# Patient Record
Sex: Female | Born: 1970 | Race: Black or African American | Hispanic: No | Marital: Single | Smoking: Never smoker
Health system: Southern US, Community
[De-identification: ages and names within clinical notes are randomized; demographics above are authoritative.]

## PROBLEM LIST (undated history)

## (undated) DIAGNOSIS — T7840XA Allergy, unspecified, initial encounter: Secondary | ICD-10-CM

## (undated) DIAGNOSIS — R7309 Other abnormal glucose: Secondary | ICD-10-CM

## (undated) DIAGNOSIS — D649 Anemia, unspecified: Secondary | ICD-10-CM

## (undated) DIAGNOSIS — R5383 Other fatigue: Secondary | ICD-10-CM

## (undated) DIAGNOSIS — R7303 Prediabetes: Secondary | ICD-10-CM

## (undated) DIAGNOSIS — E559 Vitamin D deficiency, unspecified: Secondary | ICD-10-CM

## (undated) HISTORY — DX: Prediabetes: R73.03

## (undated) HISTORY — DX: Vitamin D deficiency, unspecified: E55.9

## (undated) HISTORY — PX: TOOTH EXTRACTION: SUR596

## (undated) HISTORY — PX: WISDOM TOOTH EXTRACTION: SHX21

## (undated) HISTORY — DX: Allergy, unspecified, initial encounter: T78.40XA

## (undated) HISTORY — DX: Anemia, unspecified: D64.9

## (undated) HISTORY — DX: Other abnormal glucose: R73.09

## (undated) HISTORY — DX: Other fatigue: R53.83

---

## 1999-03-15 DIAGNOSIS — Z5189 Encounter for other specified aftercare: Secondary | ICD-10-CM

## 1999-03-15 HISTORY — DX: Encounter for other specified aftercare: Z51.89

## 2013-07-18 ENCOUNTER — Encounter (HOSPITAL_COMMUNITY): Payer: Self-pay | Admitting: Emergency Medicine

## 2013-07-18 ENCOUNTER — Emergency Department (INDEPENDENT_AMBULATORY_CARE_PROVIDER_SITE_OTHER)
Admission: EM | Admit: 2013-07-18 | Discharge: 2013-07-18 | Disposition: A | Discharge status: Home / Self Care | Payer: Self-pay | Source: Home / Self Care | Attending: Emergency Medicine | Admitting: Emergency Medicine

## 2013-07-18 DIAGNOSIS — S60459A Superficial foreign body of unspecified finger, initial encounter: Secondary | ICD-10-CM

## 2013-07-18 MED ORDER — SULFAMETHOXAZOLE-TRIMETHOPRIM 800-160 MG PO TABS: 1.0000 | ORAL_TABLET | Freq: Two times a day (BID) | ORAL | Status: DC

## 2013-07-18 NOTE — ED Notes (Signed)
Splinter of lead pencil in R small finger over PIP joint on Monday.  She tried Monday to get it out without success.

## 2013-07-18 NOTE — ED Provider Notes (Signed)
CSN: [id number]     Arrival date & time 07/18/13  1818 History   First MD Initiated Contact with Patient 07/18/13 1934     Chief Complaint  Patient presents with  . Foreign Body in Skin   (Consider location/radiation/quality/duration/timing/severity/associated sxs/prior Treatment) HPI Patient is a 43 yo F presenting with lead in her right small finger x4 days. She states she was retracting a pencil and it stuck in her finger. She tried using a sterile needle to get the lead out but was unsuccessful. 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 No fevers, no swelling, no pus or oozing. States this is very bothersome to her.  History reviewed. No pertinent past medical history. Past Surgical History  Procedure Laterality Date  . Cesarean section  1995, 2001    x 2   Family History  Problem Relation Age of Onset  . Diabetes Father   . Hypertension Father   . Cancer Father     liver and gallbladder   History  Substance Use Topics  . Smoking status: Never Smoker   . Smokeless tobacco: Not on file  . Alcohol Use: Not on file   OB History   Grav Para Term Preterm Abortions TAB SAB Ect Mult Living                 Review of Systems  Constitutional: Negative for fever and chills.  HENT: Negative for congestion.   Eyes: Negative for visual disturbance.  Respiratory: Negative for cough and shortness of breath.   Cardiovascular: Negative for chest pain and leg swelling.  Gastrointestinal: Negative for abdominal pain.  Genitourinary: Negative for dysuria.  Musculoskeletal: Negative for arthralgias, joint swelling and myalgias.  Skin: Positive for rash.  Neurological: Negative for headaches.    Allergies  Pine and Latex  Home Medications   Prior to Admission medications   Not on File   BP 113/77  Pulse 77  Temp(Src) 98.5 F (36.9 C) (Oral)  Resp 16  SpO2 100%  LMP 07/18/2013 Physical Exam  Constitutional: She is oriented to  person, place, and time. She appears well-developed and well-nourished. No distress.  HENT:  Head: Normocephalic and atraumatic.  Mouth/Throat: Oropharynx is clear and moist.  Musculoskeletal: Normal range of motion. She exhibits tenderness. She exhibits no edema.  Neurological: She is alert and oriented to person, place, and time.  Skin: Skin is warm and dry.  Abrasion noted over right small finger PIP with gray speck noted in the middle of the abrasion. Area cleaned with betadine and alcohol and prodded with 22g needle. No obvious piece of lead noted. Unable to extract any foreign body.  Psychiatric: She has a normal mood and affect.    ED Course  Procedures (including critical care time) Labs Review Labs Reviewed - No data to display  Imaging Review No results found.  MDM   1. Foreign body of finger    Lead in finger, but not obviously noted on exam - Bactrim DS x3 days since patient try to explore area as did I.  - Warm soaks - Tylenol as needed for pain - Red flags discussed - Given reassurance    Montez Morita, MD 07/18/13 2018

## 2013-07-19 NOTE — ED Provider Notes (Signed)
Medical screening examination/treatment/procedure(s) were performed by a resident physician and as supervising physician I was immediately available for consultation/collaboration.  Philipp Deputy, M.D.   Harden Mo, MD 07/19/13 1302

## 2018-12-18 ENCOUNTER — Other Ambulatory Visit: Payer: Self-pay

## 2018-12-18 DIAGNOSIS — Z20822 Contact with and (suspected) exposure to covid-19: Secondary | ICD-10-CM

## 2018-12-18 NOTE — Progress Notes (Signed)
[id number] 

## 2018-12-21 LAB — NOVEL CORONAVIRUS, NAA: SARS-CoV-2, NAA: NOT DETECTED

## 2019-02-15 ENCOUNTER — Other Ambulatory Visit: Payer: Self-pay

## 2019-02-15 DIAGNOSIS — Z20822 Contact with and (suspected) exposure to covid-19: Secondary | ICD-10-CM

## 2019-02-16 LAB — NOVEL CORONAVIRUS, NAA: SARS-CoV-2, NAA: NOT DETECTED

## 2019-08-17 ENCOUNTER — Ambulatory Visit: Payer: Self-pay | Attending: Internal Medicine

## 2019-08-17 DIAGNOSIS — Z23 Encounter for immunization: Secondary | ICD-10-CM

## 2019-08-17 NOTE — Progress Notes (Signed)
   Covid-19 Vaccination Clinic  Name:  Kathy Merritt    MRN: [id number] DOB: 03/22/1970  08/17/2019  Kathy Merritt was observed post Covid-19 immunization for 15 minutes without incident. She was provided with Vaccine Information Sheet and instruction to access the V-Safe system.   Kathy Merritt was instructed to call 911 with any severe reactions post vaccine: Marland Kitchen Difficulty breathing  . Swelling of face and throat  . A fast heartbeat  . A bad rash all over body  . Dizziness and weakness   Immunizations Administered    Name Date Dose VIS Date Route   JANSSEN COVID-19 VACCINE 08/17/2019  9:47 AM 0.5 mL 05/11/2019 Intramuscular   Manufacturer: [employer]   Lot: [id number]   Gage: [phone]

## 2019-09-15 ENCOUNTER — Ambulatory Visit (HOSPITAL_COMMUNITY)
Admission: RE | Admit: 2019-09-15 | Discharge: 2019-09-15 | Disposition: A | Discharge status: Home / Self Care | Payer: Self-pay | Source: Ambulatory Visit | Attending: Family Medicine | Admitting: Family Medicine

## 2019-09-15 ENCOUNTER — Encounter (HOSPITAL_COMMUNITY): Payer: Self-pay

## 2019-09-15 ENCOUNTER — Other Ambulatory Visit: Payer: Self-pay

## 2019-09-15 VITALS — BP 124/86 | HR 93 | Temp 99.2°F | Resp 18

## 2019-09-15 DIAGNOSIS — T7840XA Allergy, unspecified, initial encounter: Secondary | ICD-10-CM

## 2019-09-15 MED ORDER — PREDNISONE 20 MG PO TABS: ORAL_TABLET | ORAL | 0 refills | Status: DC

## 2019-09-15 NOTE — ED Triage Notes (Signed)
Pt presents with itching rash on arms, kegs and back x 5 days. Cortisone cream gives somewhat relieve.

## 2019-09-15 NOTE — ED Provider Notes (Addendum)
[facility]    CSN: [id number] Arrival date & time: 09/15/19  1039      History   Chief Complaint Chief Complaint  Patient presents with  . Rash    HPI Kathy Merritt is a 49 y.o. female.   First [facility] patient visit since 2015 for this 49 yo woman complaining of generalized body rash.  Patient received SARS vaccine 08/17/2019.  This patient has been on a fast prescribed by her pastor which includes a special kind of tea.  She has been on this for about 4 weeks and her rash began about 5 days ago.  The rash involves primarily the inner aspect of her arms and thighs with some dermatographia on the legs.  Cortisone cream seems to help a bit.  Pruritic sandpaper quality to the rash.     History reviewed. No pertinent past medical history.  There are no problems to display for this patient.   Past Surgical History:  Procedure Laterality Date  . [place], 2001   x 2    OB History   No obstetric history on file.      Home Medications    Prior to Admission medications   Medication Sig Start Date End Date Taking? Authorizing Provider  b complex vitamins capsule Take 1 capsule by mouth daily.    [provider]  calcium citrate-vitamin D (CITRACAL+D) 315-200 MG-UNIT per tablet Take 1 tablet by mouth 2 (two) times daily.    [provider]  magnesium 30 MG tablet Take 30 mg by mouth 2 (two) times daily.    [provider]  Multiple Vitamins-Minerals (MULTIVITAMIN WITH MINERALS) tablet Take 1 tablet by mouth daily.    [provider]  predniSONE (DELTASONE) 20 MG tablet One daily with fluids and cracker. 09/15/19   Robyn Haber, MD  sulfamethoxazole-trimethoprim (BACTRIM DS) 800-160 MG per tablet Take 1 tablet by mouth 2 (two) times daily. 07/18/13   Hairford, Tyler Pita, MD    Family History Family History  Problem Relation Age of Onset  . Diabetes Father   . Hypertension Father   . Cancer Father         liver and gallbladder    Social History Social History   Tobacco Use  . Smoking status: Never Smoker  . Smokeless tobacco: Never Used  Substance Use Topics  . Alcohol use: Not on file  . Drug use: No     Allergies   Pine and Latex   Review of Systems Review of Systems  Constitutional: Negative.   HENT: Negative.   Gastrointestinal: Negative.   Musculoskeletal: Negative.   Skin: Positive for rash.     Physical Exam Triage Vital Signs ED Triage Vitals  Enc Vitals Group     BP      Pulse      Resp      Temp      Temp src      SpO2      Weight      Height      Head Circumference      Peak Flow      Pain Score      Pain Loc      Pain Edu?      Excl. in [place]?    No data found.  Updated Vital Signs BP 124/86 (BP Location: Right Arm)   Pulse 93   Temp 99.2 F (37.3 C) (Oral)   Resp 18   LMP 07/18/2013  SpO2 100%   Physical Exam Vitals and nursing note reviewed.  Constitutional:      General: She is not in acute distress.    Appearance: Normal appearance. She is obese. She is not ill-appearing or toxic-appearing.  HENT:     Head: Normocephalic.     Mouth/Throat:     Pharynx: Oropharynx is clear.  Eyes:     Conjunctiva/sclera: Conjunctivae normal.  Pulmonary:     Effort: Pulmonary effort is normal.  Musculoskeletal:        General: Normal range of motion.     Cervical back: Normal range of motion and neck supple.  Skin:    Findings: Rash present.  Neurological:     General: No focal deficit present.     Mental Status: She is alert.  Psychiatric:        Mood and Affect: Mood normal.        Thought Content: Thought content normal.        Judgment: Judgment normal.        UC Treatments / Results  Labs (all labs ordered are listed, but only abnormal results are displayed) Labs Reviewed - No data to display  EKG   Radiology No results found.  Procedures Procedures (including critical care time)  Medications Ordered in  UC Medications - No data to display  Initial Impression / Assessment and Plan / UC Course  I have reviewed the triage vital signs and the nursing notes.  Pertinent labs & imaging results that were available during my care of the patient were reviewed by me and considered in my medical decision making (see chart for details).    Final Clinical Impressions(s) / UC Diagnoses   Final diagnoses:  Allergic reaction, initial encounter     Discharge Instructions     Switch to a different kind of tea since we are suspicious that this brand may have some chemicals that are causing the rash.    ED Prescriptions    Medication Sig Dispense Auth. Provider   predniSONE (DELTASONE) 20 MG tablet One daily with fluids and cracker. 5 tablet Robyn Haber, MD     PDMP not reviewed this encounter.   Robyn Haber, MD 09/15/19 1126    Robyn Haber, MD 09/15/19 1128    Robyn Haber, MD 09/15/19 1130

## 2019-09-15 NOTE — Discharge Instructions (Addendum)
Switch to a different kind of tea since we are suspicious that this brand may have some chemicals that are causing the rash.

## 2020-01-20 ENCOUNTER — Other Ambulatory Visit: Payer: Self-pay

## 2020-01-20 DIAGNOSIS — Z20822 Contact with and (suspected) exposure to covid-19: Secondary | ICD-10-CM | POA: Diagnosis not present

## 2020-01-21 LAB — SARS-COV-2, NAA 2 DAY TAT

## 2020-01-21 LAB — NOVEL CORONAVIRUS, NAA: SARS-CoV-2, NAA: NOT DETECTED

## 2020-01-28 ENCOUNTER — Other Ambulatory Visit: Payer: Self-pay | Admitting: Family Medicine

## 2020-01-28 ENCOUNTER — Other Ambulatory Visit: Payer: Self-pay

## 2020-01-28 ENCOUNTER — Ambulatory Visit
Admission: RE | Admit: 2020-01-28 | Discharge: 2020-01-28 | Disposition: A | Discharge status: Home / Self Care | Payer: 59 | Source: Ambulatory Visit | Attending: Family Medicine | Admitting: Family Medicine

## 2020-01-28 DIAGNOSIS — Z1231 Encounter for screening mammogram for malignant neoplasm of breast: Secondary | ICD-10-CM

## 2020-01-30 ENCOUNTER — Other Ambulatory Visit: Payer: 59

## 2020-01-30 DIAGNOSIS — Z20822 Contact with and (suspected) exposure to covid-19: Secondary | ICD-10-CM

## 2020-01-31 LAB — SARS-COV-2, NAA 2 DAY TAT

## 2020-01-31 LAB — NOVEL CORONAVIRUS, NAA: SARS-CoV-2, NAA: NOT DETECTED

## 2020-02-03 ENCOUNTER — Other Ambulatory Visit: Payer: Self-pay | Admitting: Family Medicine

## 2020-02-03 DIAGNOSIS — R928 Other abnormal and inconclusive findings on diagnostic imaging of breast: Secondary | ICD-10-CM

## 2020-02-10 ENCOUNTER — Other Ambulatory Visit: Payer: 59

## 2020-02-10 DIAGNOSIS — Z20822 Contact with and (suspected) exposure to covid-19: Secondary | ICD-10-CM | POA: Diagnosis not present

## 2020-02-12 ENCOUNTER — Ambulatory Visit: Payer: 59 | Admitting: Physician Assistant

## 2020-02-12 LAB — SARS-COV-2, NAA 2 DAY TAT

## 2020-02-12 LAB — NOVEL CORONAVIRUS, NAA: SARS-CoV-2, NAA: NOT DETECTED

## 2020-02-17 ENCOUNTER — Ambulatory Visit: Payer: 59 | Admitting: Nurse Practitioner

## 2020-02-19 ENCOUNTER — Ambulatory Visit: Payer: 59

## 2020-02-19 ENCOUNTER — Other Ambulatory Visit: Payer: Self-pay

## 2020-02-19 ENCOUNTER — Other Ambulatory Visit: Payer: Self-pay | Admitting: Family Medicine

## 2020-02-19 ENCOUNTER — Ambulatory Visit
Admission: RE | Admit: 2020-02-19 | Discharge: 2020-02-19 | Disposition: A | Discharge status: Home / Self Care | Payer: 59 | Source: Ambulatory Visit | Attending: Family Medicine | Admitting: Family Medicine

## 2020-02-19 DIAGNOSIS — R928 Other abnormal and inconclusive findings on diagnostic imaging of breast: Secondary | ICD-10-CM

## 2020-02-19 DIAGNOSIS — N6321 Unspecified lump in the left breast, upper outer quadrant: Secondary | ICD-10-CM | POA: Diagnosis not present

## 2020-02-27 ENCOUNTER — Other Ambulatory Visit: Payer: 59

## 2020-02-28 ENCOUNTER — Other Ambulatory Visit: Payer: 59

## 2020-03-04 ENCOUNTER — Other Ambulatory Visit: Payer: 59

## 2020-03-23 ENCOUNTER — Ambulatory Visit
Admission: RE | Admit: 2020-03-23 | Discharge: 2020-03-23 | Disposition: A | Discharge status: Home / Self Care | Payer: 59 | Source: Ambulatory Visit | Attending: Family Medicine | Admitting: Family Medicine

## 2020-03-23 ENCOUNTER — Other Ambulatory Visit: Payer: Self-pay

## 2020-03-23 DIAGNOSIS — R928 Other abnormal and inconclusive findings on diagnostic imaging of breast: Secondary | ICD-10-CM

## 2020-03-23 DIAGNOSIS — N6321 Unspecified lump in the left breast, upper outer quadrant: Secondary | ICD-10-CM | POA: Diagnosis not present

## 2020-03-23 DIAGNOSIS — D242 Benign neoplasm of left breast: Secondary | ICD-10-CM | POA: Diagnosis not present

## 2020-03-24 ENCOUNTER — Other Ambulatory Visit: Payer: 59

## 2020-03-26 ENCOUNTER — Other Ambulatory Visit: Payer: 59

## 2020-03-31 ENCOUNTER — Other Ambulatory Visit: Payer: 59

## 2020-03-31 ENCOUNTER — Ambulatory Visit: Payer: 59 | Admitting: Family Medicine

## 2020-04-02 ENCOUNTER — Telehealth (INDEPENDENT_AMBULATORY_CARE_PROVIDER_SITE_OTHER): Payer: 59 | Admitting: Nurse Practitioner

## 2020-04-02 ENCOUNTER — Other Ambulatory Visit: Payer: 59

## 2020-04-02 DIAGNOSIS — U071 COVID-19: Secondary | ICD-10-CM | POA: Diagnosis not present

## 2020-04-02 NOTE — Patient Instructions (Signed)
Covid 19:   Stay well hydrated  Stay active  Deep breathing exercises  May start vitamin C daily, vitamin D3 daily, Zinc daily  May take tylenol for fever or pain  May take mucinex twice daily  May start zyrtec    Follow up:  Follow up in 2 weeks or sooner if needed

## 2020-04-02 NOTE — Progress Notes (Signed)
Virtual Visit via Telephone Note  I connected with Kathy Merritt on 04/02/20 at  1:00 PM EST by telephone and verified that I am speaking with the correct person using two identifiers.  Location: Patient: home Provider: remote   I discussed the limitations, risks, security and privacy concerns of performing an evaluation and management service by telephone and the availability of in person appointments. I also discussed with the patient that there may be a patient responsible charge related to this service. The patient expressed understanding and agreed to proceed.   History of Present Illness:  Patient presents today for post-COVID care clinic visit.  Patient states that her symptoms started on 03/24/2019.  She tested positive the next day through health at work.  Patient states that she is still having headache, fatigue, pressure behind eyes, mucus in her throat.  Patient is staying well-hydrated.  She is trying to eat well although her appetite is decreased. Denies f/c/s, n/v/d, hemoptysis, PND, chest pain or edema.     Observations/Objective:  Vitals with BMI 09/15/2019 2/0/9470 11/18/2834  Systolic 629 476 546  Diastolic 86 84 77  Pulse - 93 77      Assessment and Plan:  Covid 19:   Stay well hydrated  Stay active  Deep breathing exercises  May start vitamin C daily, vitamin D3 daily, Zinc daily  May take tylenol for fever or pain  May take mucinex twice daily  May start zyrtec   Follow Up Instructions:  Follow up in 2 weeks or sooner if needed    I discussed the assessment and treatment plan with the patient. The patient was provided an opportunity to ask questions and all were answered. The patient agreed with the plan and demonstrated an understanding of the instructions.   The patient was advised to call back or seek an in-person evaluation if the symptoms worsen or if the condition fails to improve as anticipated.  I provided 22 minutes of  non-face-to-face time during this encounter.   Fenton Foy, NP

## 2020-04-07 DIAGNOSIS — U071 COVID-19: Secondary | ICD-10-CM | POA: Insufficient documentation

## 2020-04-09 ENCOUNTER — Other Ambulatory Visit: Payer: 59

## 2020-04-09 DIAGNOSIS — Z20822 Contact with and (suspected) exposure to covid-19: Secondary | ICD-10-CM | POA: Diagnosis not present

## 2020-04-10 LAB — SARS-COV-2, NAA 2 DAY TAT

## 2020-04-10 LAB — NOVEL CORONAVIRUS, NAA: SARS-CoV-2, NAA: NOT DETECTED

## 2020-04-14 ENCOUNTER — Other Ambulatory Visit: Payer: 59

## 2020-04-14 DIAGNOSIS — Z20822 Contact with and (suspected) exposure to covid-19: Secondary | ICD-10-CM

## 2020-04-16 LAB — NOVEL CORONAVIRUS, NAA: SARS-CoV-2, NAA: NOT DETECTED

## 2020-04-16 LAB — SARS-COV-2, NAA 2 DAY TAT

## 2020-04-20 ENCOUNTER — Other Ambulatory Visit: Payer: Self-pay | Admitting: Family Medicine

## 2020-04-20 DIAGNOSIS — N63 Unspecified lump in unspecified breast: Secondary | ICD-10-CM

## 2020-04-23 ENCOUNTER — Other Ambulatory Visit: Payer: 59

## 2020-04-23 DIAGNOSIS — Z20822 Contact with and (suspected) exposure to covid-19: Secondary | ICD-10-CM | POA: Diagnosis not present

## 2020-04-24 LAB — NOVEL CORONAVIRUS, NAA: SARS-CoV-2, NAA: NOT DETECTED

## 2020-04-24 LAB — SARS-COV-2, NAA 2 DAY TAT

## 2020-04-30 ENCOUNTER — Other Ambulatory Visit: Payer: 59

## 2020-04-30 DIAGNOSIS — Z20822 Contact with and (suspected) exposure to covid-19: Secondary | ICD-10-CM | POA: Diagnosis not present

## 2020-05-01 LAB — NOVEL CORONAVIRUS, NAA: SARS-CoV-2, NAA: NOT DETECTED

## 2020-05-01 LAB — SARS-COV-2, NAA 2 DAY TAT

## 2020-05-05 ENCOUNTER — Other Ambulatory Visit: Payer: 59

## 2020-05-07 ENCOUNTER — Other Ambulatory Visit: Payer: 59

## 2020-05-07 DIAGNOSIS — Z20822 Contact with and (suspected) exposure to covid-19: Secondary | ICD-10-CM | POA: Diagnosis not present

## 2020-05-08 LAB — NOVEL CORONAVIRUS, NAA: SARS-CoV-2, NAA: NOT DETECTED

## 2020-05-08 LAB — SARS-COV-2, NAA 2 DAY TAT

## 2020-05-12 ENCOUNTER — Other Ambulatory Visit: Payer: 59

## 2020-05-12 DIAGNOSIS — Z20822 Contact with and (suspected) exposure to covid-19: Secondary | ICD-10-CM

## 2020-05-13 LAB — SARS-COV-2, NAA 2 DAY TAT

## 2020-05-13 LAB — NOVEL CORONAVIRUS, NAA: SARS-CoV-2, NAA: NOT DETECTED

## 2020-05-19 ENCOUNTER — Other Ambulatory Visit: Payer: 59

## 2020-05-19 DIAGNOSIS — Z20822 Contact with and (suspected) exposure to covid-19: Secondary | ICD-10-CM

## 2020-05-20 ENCOUNTER — Other Ambulatory Visit: Payer: Self-pay

## 2020-05-20 ENCOUNTER — Ambulatory Visit: Payer: 59 | Admitting: Nurse Practitioner

## 2020-05-20 ENCOUNTER — Encounter: Payer: Self-pay | Admitting: Nurse Practitioner

## 2020-05-20 VITALS — BP 138/82 | HR 69 | Temp 98.5°F | Ht 64.2 in | Wt 192.4 lb

## 2020-05-20 DIAGNOSIS — Z7689 Persons encountering health services in other specified circumstances: Secondary | ICD-10-CM | POA: Diagnosis not present

## 2020-05-20 DIAGNOSIS — L603 Nail dystrophy: Secondary | ICD-10-CM

## 2020-05-20 DIAGNOSIS — R030 Elevated blood-pressure reading, without diagnosis of hypertension: Secondary | ICD-10-CM | POA: Diagnosis not present

## 2020-05-20 DIAGNOSIS — R946 Abnormal results of thyroid function studies: Secondary | ICD-10-CM

## 2020-05-20 DIAGNOSIS — N912 Amenorrhea, unspecified: Secondary | ICD-10-CM | POA: Diagnosis not present

## 2020-05-20 DIAGNOSIS — Z862 Personal history of diseases of the blood and blood-forming organs and certain disorders involving the immune mechanism: Secondary | ICD-10-CM | POA: Diagnosis not present

## 2020-05-20 DIAGNOSIS — Z1159 Encounter for screening for other viral diseases: Secondary | ICD-10-CM

## 2020-05-20 DIAGNOSIS — R635 Abnormal weight gain: Secondary | ICD-10-CM

## 2020-05-20 LAB — POCT URINALYSIS DIPSTICK
Bilirubin, UA: NEGATIVE
Blood, UA: NEGATIVE
Glucose, UA: NEGATIVE
Ketones, UA: NEGATIVE
Leukocytes, UA: NEGATIVE
Nitrite, UA: NEGATIVE
Protein, UA: NEGATIVE
Spec Grav, UA: 1.01 (ref 1.010–1.025)
Urobilinogen, UA: 0.2 E.U./dL
pH, UA: 6 (ref 5.0–8.0)

## 2020-05-20 LAB — POCT UA - MICROALBUMIN
Albumin/Creatinine Ratio, Urine, POC: 30
Creatinine, POC: 200 mg/dL
Microalbumin Ur, POC: 10 mg/L

## 2020-05-20 LAB — SARS-COV-2, NAA 2 DAY TAT

## 2020-05-20 LAB — NOVEL CORONAVIRUS, NAA: SARS-CoV-2, NAA: NOT DETECTED

## 2020-05-20 MED ORDER — SILICA 12.5 MG PO CAPS: 12.5000 mg | ORAL_CAPSULE | Freq: Every day | ORAL | 0 refills | Status: DC

## 2020-05-20 NOTE — Progress Notes (Signed)
Rutherford Nail as a scribe for Minette Brine, FNP.,have documented all relevant documentation on the behalf of Minette Brine, FNP,as directed by  Minette Brine, FNP while in the presence of Minette Brine, [place]. This visit occurred during the SARS-CoV-2 public health emergency.  Safety protocols were in place, including screening questions prior to the visit, additional usage of staff PPE, and extensive cleaning of exam room while observing appropriate contact time as indicated for disinfecting solutions.  Subjective:     Patient ID: Kathy Merritt , female    DOB: [birth year] , 50 y.o.   MRN: [id number]   Chief Complaint  Patient presents with  . Establish Care  . 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 bloodwork    Patient would like her estrogen levels to be checked she has had some changes happening.    HPI  Patient here today to establish care.  She works at [employer] at [employer].  Divorced. She has 2 grown children, live in [place].  She wanted a change when she was going through a divorce. She has an associates Degree in [employer].  She has been here in [place] since Jun 17, 2005 and has not seen a PCP since 17-Jun-2000.    PMH - she had a fall and injured her back in 1990's - was using a TENS unit. Her second child she had blood transfusions. She has also had borderline problems with her thyroid.   [facility]- mother - mostly healthy, back pain and arthritis. Father - deceased 18-Jun-2003) - liver cancer, spleen. Sister (share mother and father) - HTN, alopecia. Sister - youngest lupus (diagnosed before pandemic), sister Otila Kluver) - unknown.  Maternal grandmother - dementia. Maternal grandfather - deceased. Paternal grandmother and paternal grandfather - deceased.   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 She has not had a menstrual cycle April 30, 2015. Then 18-Jun-2018. That was the last  menstrual cycle she had.    She drinks at least 8 glasses of water a day.  She is taking vitamins daily  She has been fluctuating with her weight.  She feels her hair is coming in more thin. Her toenails are looking differently.     History reviewed. No pertinent past medical history.   Family History  Problem Relation Age of Onset  . Healthy Mother   . Diabetes Father   . Hypertension Father   . Cancer Father        liver and gallbladder  . Dementia Maternal Grandmother      Current Outpatient Medications:  .  ascorbic acid (VITAMIN C) 250 MG CHEW, Chew 250 mg by mouth daily., Disp: , Rfl:  .  b complex vitamins capsule, Take 1 capsule by mouth daily., Disp: , Rfl:  .  cholecalciferol (VITAMIN D) 25 MCG (1000 UNIT) tablet, Take 1,000 Units by mouth daily. Takes 2-3 gummies every other day, Disp: , Rfl:  .  Cyanocobalamin (VITAMIN B-12) 500 MCG SUBL, Place under the tongue., Disp: , Rfl:  .  magnesium 30 MG tablet, Take 30 mg by mouth 2 (two) times daily., Disp: , Rfl:  .  Multiple Vitamins-Minerals (MULTIVITAMIN WITH MINERALS) tablet, Take 1 tablet by mouth daily., Disp: , Rfl:  .  Nutritional Supplements (SILICA) 36.1 MG CAPS, Take 1 capsule (12.5 mg total) by mouth daily., Disp: 30 capsule, Rfl: 0   Allergies  Allergen Reactions  . Pine Shortness Of Breath    wheezing  . Latex Itching and Rash     Review of Systems  Constitutional: Negative.   HENT: Negative.   Eyes: Negative.   Respiratory: Negative.   Cardiovascular: Negative.  Negative for chest pain, palpitations and leg swelling.  Gastrointestinal: Negative.   Endocrine: Negative.   Genitourinary: Negative.   Musculoskeletal: Negative.   Skin: Negative.   Neurological: Negative.   Hematological: Negative.   Psychiatric/Behavioral: Negative.      Today's Vitals   05/20/20 1536  BP: 138/82  Pulse: 69  Temp: 98.5 F (36.9 C)  TempSrc: Oral  Weight: 192 lb 6.4 oz (87.3 kg)  Height: 5' 4.2" (1.631 m)   PainSc: 0-No pain   Body mass index is 32.82 kg/m.   Objective:  Physical Exam Constitutional:      General: She is not in acute distress.    Appearance: Normal appearance. She is obese.  Cardiovascular:     Rate and Rhythm: Normal rate and regular rhythm.     Pulses: Normal pulses.     Heart sounds: Normal heart sounds.  Pulmonary:     Effort: Pulmonary effort is normal. No respiratory distress.     Breath sounds: Normal breath sounds. No wheezing.  Abdominal:     General: Abdomen is flat. Bowel sounds are normal.     Palpations: Abdomen is soft.  Musculoskeletal:        General: Normal range of motion.     Cervical back: Normal range of motion and neck supple.  Skin:    General: Skin is warm and dry.     Capillary Refill: Capillary refill takes less than 2 seconds.  Neurological:     General: No focal deficit present.     Mental Status: She is alert and oriented to person, place, and time.  Psychiatric:        Mood and Affect: Mood normal.        Behavior: Behavior normal.        Thought Content: Thought content normal.        Judgment: Judgment normal.         Assessment And Plan:    1. 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 Encounter for hepatitis C screening test for low risk patient  Will check Hepatitis C screening due to recent recommendations to screen all adults 18 years and older - VITAMIN D 25 Hydroxy (Vit-D Deficiency, Fractures) - Hepatitis C antibody  3. Abnormal weight gain  She reports having a fluctuating weight  This is her first time at our office   Will check metabolic cause - TSH  4. Amenorrhea  She has not had a menstrual cycle consistently in 2-3 years  Will check hormone levels - CBC - Hemoglobin A1c - FSH - LH  5. History of anemia - CBC  6.  Elevated blood-pressure reading without diagnosis of hypertension  Blood pressure is slightly elevated today but this is her first office visit  Encouraged to avoid high salt foods. - POCT Urinalysis Dipstick (81002) - POCT UA - Microalbumin  7. Abnormal thyroid function test - Ambulatory referral to Endocrinology   8. Encounter to establish care with new doctor  Patient was given opportunity to ask questions. Patient verbalized understanding  of the plan and was able to repeat key elements of the plan. All questions were answered to their satisfaction.  Minette Brine, FNP   I, Minette Brine, FNP, have reviewed all documentation for this visit. The documentation on 05/20/20 for the exam, diagnosis, procedures, and orders are all accurate and complete.   THE PATIENT IS ENCOURAGED TO PRACTICE SOCIAL DISTANCING DUE TO THE COVID-19 PANDEMIC.

## 2020-05-20 NOTE — Patient Instructions (Signed)
Low-Sodium Eating Plan Sodium, which is an element that makes up salt, helps you maintain a healthy balance of fluids in your body. Too much sodium can increase your blood pressure and cause fluid and waste to be held in your body. Your health care provider or dietitian may recommend following this plan if you have high blood pressure (hypertension), kidney disease, liver disease, or heart failure. Eating less sodium can help lower your blood pressure, reduce swelling, and protect your heart, liver, and kidneys. What are tips for following this plan? Reading food labels  The Nutrition Facts label lists the amount of sodium in one serving of the food. If you eat more than one serving, you must multiply the listed amount of sodium by the number of servings.  Choose foods with less than 140 mg of sodium per serving.  Avoid foods with 300 mg of sodium or more per serving. Shopping  Look for lower-sodium products, often labeled as "low-sodium" or "no salt added."  Always check the sodium content, even if foods are labeled as "unsalted" or "no salt added."  Buy fresh foods. ? Avoid canned foods and pre-made or frozen meals. ? Avoid canned, cured, or processed meats.  Buy breads that have less than 80 mg of sodium per slice.   Cooking  Eat more home-cooked food and less restaurant, buffet, and fast food.  Avoid adding salt when cooking. Use salt-free seasonings or herbs instead of table salt or sea salt. Check with your health care provider or pharmacist before using salt substitutes.  Cook with plant-based oils, such as canola, sunflower, or olive oil.   Meal planning  When eating at a restaurant, ask that your food be prepared with less salt or no salt, if possible. Avoid dishes labeled as brined, pickled, cured, smoked, or made with soy sauce, miso, or teriyaki sauce.  Avoid foods that contain MSG (monosodium glutamate). MSG is sometimes added to Chinese food, bouillon, and some canned  foods.  Make meals that can be grilled, baked, poached, roasted, or steamed. These are generally made with less sodium. General information Most people on this plan should limit their sodium intake to 1,500-2,000 mg (milligrams) of sodium each day. What foods should I eat? Fruits Fresh, frozen, or canned fruit. Fruit juice. Vegetables Fresh or frozen vegetables. "No salt added" canned vegetables. "No salt added" tomato sauce and paste. Low-sodium or reduced-sodium tomato and vegetable juice. Grains Low-sodium cereals, including oats, puffed wheat and rice, and shredded wheat. Low-sodium crackers. Unsalted rice. Unsalted pasta. Low-sodium bread. Whole-grain breads and whole-grain pasta. Meats and other proteins Fresh or frozen (no salt added) meat, poultry, seafood, and fish. Low-sodium canned tuna and salmon. Unsalted nuts. Dried peas, beans, and lentils without added salt. Unsalted canned beans. Eggs. Unsalted nut butters. Dairy Milk. Soy milk. Cheese that is naturally low in sodium, such as ricotta cheese, fresh mozzarella, or Swiss cheese. Low-sodium or reduced-sodium cheese. Cream cheese. Yogurt. Seasonings and condiments Fresh and dried herbs and spices. Salt-free seasonings. Low-sodium mustard and ketchup. Sodium-free salad dressing. Sodium-free light mayonnaise. Fresh or refrigerated horseradish. Lemon juice. Vinegar. Other foods Homemade, reduced-sodium, or low-sodium soups. Unsalted popcorn and pretzels. Low-salt or salt-free chips. The items listed above may not be a complete list of foods and beverages you can eat. Contact a dietitian for more information. What foods should I avoid? Vegetables Sauerkraut, pickled vegetables, and relishes. Olives. French fries. Onion rings. Regular canned vegetables (not low-sodium or reduced-sodium). Regular canned tomato sauce and paste (not low-sodium   or reduced-sodium). Regular tomato and vegetable juice (not low-sodium or reduced-sodium). Frozen  vegetables in sauces. Grains Instant hot cereals. Bread stuffing, pancake, and biscuit mixes. Croutons. Seasoned rice or pasta mixes. Noodle soup cups. Boxed or frozen macaroni and cheese. Regular salted crackers. Self-rising flour. Meats and other proteins Meat or fish that is salted, canned, smoked, spiced, or pickled. Precooked or cured meat, such as sausages or meat loaves. Bacon. Ham. Pepperoni. Hot dogs. Corned beef. Chipped beef. Salt pork. Jerky. Pickled herring. Anchovies and sardines. Regular canned tuna. Salted nuts. Dairy Processed cheese and cheese spreads. Hard cheeses. Cheese curds. Blue cheese. Feta cheese. String cheese. Regular cottage cheese. Buttermilk. Canned milk. Fats and oils Salted butter. Regular margarine. Ghee. Bacon fat. Seasonings and condiments Onion salt, garlic salt, seasoned salt, table salt, and sea salt. Canned and packaged gravies. Worcestershire sauce. Tartar sauce. Barbecue sauce. Teriyaki sauce. Soy sauce, including reduced-sodium. Steak sauce. Fish sauce. Oyster sauce. Cocktail sauce. Horseradish that you find on the shelf. Regular ketchup and mustard. Meat flavorings and tenderizers. Bouillon cubes. Hot sauce. Pre-made or packaged marinades. Pre-made or packaged taco seasonings. Relishes. Regular salad dressings. Salsa. Other foods Salted popcorn and pretzels. Corn chips and puffs. Potato and tortilla chips. Canned or dried soups. Pizza. Frozen entrees and pot pies. The items listed above may not be a complete list of foods and beverages you should avoid. Contact a dietitian for more information. Summary  Eating less sodium can help lower your blood pressure, reduce swelling, and protect your heart, liver, and kidneys.  Most people on this plan should limit their sodium intake to 1,500-2,000 mg (milligrams) of sodium each day.  Canned, boxed, and frozen foods are high in sodium. Restaurant foods, fast foods, and pizza are also very high in sodium. You  also get sodium by adding salt to food.  Try to cook at home, eat more fresh fruits and vegetables, and eat less fast food and canned, processed, or prepared foods. This information is not intended to replace advice given to you by your health care provider. Make sure you discuss any questions you have with your health care provider. Document Revised: 04/05/2019 Document Reviewed: 01/30/2019 Elsevier Patient Education  2021 [employer]  

## 2020-05-21 ENCOUNTER — Encounter: Payer: Self-pay | Admitting: Nurse Practitioner

## 2020-05-21 LAB — VITAMIN B12: Vitamin B-12: >2000 pg/mL — ABNORMAL HIGH (ref 232–1245)

## 2020-05-21 LAB — CBC
Hematocrit: 35.5 % (ref 34.0–46.6)
Hemoglobin: 11.5 g/dL (ref 11.1–15.9)
MCH: 27.1 pg (ref 26.6–33.0)
MCHC: 32.4 g/dL (ref 31.5–35.7)
MCV: 84 fL (ref 79–97)
Platelets: 397 10*3/uL (ref 150–450)
RBC: 4.25 x10E6/uL (ref 3.77–5.28)
RDW: 13.9 % (ref 11.7–15.4)
WBC: 6.8 10*3/uL (ref 3.4–10.8)

## 2020-05-21 LAB — IRON,TIBC AND FERRITIN PANEL
Ferritin: 183 ng/mL — ABNORMAL HIGH (ref 15–150)
Iron Saturation: 19 % (ref 15–55)
Iron: 57 ug/dL (ref 27–159)
Total Iron Binding Capacity: 302 ug/dL (ref 250–450)
UIBC: 245 ug/dL (ref 131–425)

## 2020-05-21 LAB — HEMOGLOBIN A1C
Est. average glucose Bld gHb Est-mCnc: 131 mg/dL
Hgb A1c MFr Bld: 6.2 % — ABNORMAL HIGH (ref 4.8–5.6)

## 2020-05-21 LAB — LUTEINIZING HORMONE: LH: 30.1 m[IU]/mL

## 2020-05-21 LAB — VITAMIN D 25 HYDROXY (VIT D DEFICIENCY, FRACTURES): Vit D, 25-Hydroxy: 36.2 ng/mL (ref 30.0–100.0)

## 2020-05-21 LAB — FOLLICLE STIMULATING HORMONE: FSH: 45.4 m[IU]/mL

## 2020-05-21 LAB — TSH: TSH: 0.358 u[IU]/mL — ABNORMAL LOW (ref 0.450–4.500)

## 2020-05-21 LAB — HEPATITIS C ANTIBODY: Hep C Virus Ab: <0.1 s/co ratio (ref 0.0–0.9)

## 2020-05-26 ENCOUNTER — Ambulatory Visit: Payer: 59 | Attending: Critical Care Medicine

## 2020-05-26 DIAGNOSIS — Z20822 Contact with and (suspected) exposure to covid-19: Secondary | ICD-10-CM

## 2020-05-27 LAB — NOVEL CORONAVIRUS, NAA: SARS-CoV-2, NAA: NOT DETECTED

## 2020-05-27 LAB — SARS-COV-2, NAA 2 DAY TAT

## 2020-05-31 ENCOUNTER — Encounter: Payer: Self-pay | Admitting: Nurse Practitioner

## 2020-06-02 ENCOUNTER — Other Ambulatory Visit: Payer: 59

## 2020-06-11 DIAGNOSIS — Z20822 Contact with and (suspected) exposure to covid-19: Secondary | ICD-10-CM | POA: Diagnosis not present

## 2020-06-16 DIAGNOSIS — Z20822 Contact with and (suspected) exposure to covid-19: Secondary | ICD-10-CM | POA: Diagnosis not present

## 2020-06-19 ENCOUNTER — Ambulatory Visit: Payer: 59 | Admitting: Family Medicine

## 2020-06-21 ENCOUNTER — Encounter: Payer: Self-pay | Admitting: Nurse Practitioner

## 2020-06-23 DIAGNOSIS — Z20822 Contact with and (suspected) exposure to covid-19: Secondary | ICD-10-CM | POA: Diagnosis not present

## 2020-06-30 DIAGNOSIS — Z20822 Contact with and (suspected) exposure to covid-19: Secondary | ICD-10-CM | POA: Diagnosis not present

## 2020-07-15 ENCOUNTER — Ambulatory Visit: Payer: 59 | Admitting: Nurse Practitioner

## 2020-07-16 ENCOUNTER — Encounter: Payer: 59 | Admitting: Nurse Practitioner

## 2020-07-23 ENCOUNTER — Encounter: Payer: Self-pay | Admitting: Internal Medicine

## 2020-07-23 ENCOUNTER — Other Ambulatory Visit: Payer: Self-pay

## 2020-07-23 ENCOUNTER — Ambulatory Visit: Payer: 59 | Admitting: Internal Medicine

## 2020-07-23 VITALS — BP 128/90 | HR 78 | Ht 64.2 in | Wt 169.6 lb

## 2020-07-23 DIAGNOSIS — R7989 Other specified abnormal findings of blood chemistry: Secondary | ICD-10-CM | POA: Diagnosis not present

## 2020-07-23 LAB — TSH: TSH: 0.24 u[IU]/mL — ABNORMAL LOW (ref 0.35–4.50)

## 2020-07-23 LAB — T4, FREE: Free T4: 0.77 ng/dL (ref 0.60–1.60)

## 2020-07-23 LAB — T3, FREE: T3, Free: 3.1 pg/mL (ref 2.3–4.2)

## 2020-07-23 NOTE — Patient Instructions (Addendum)
Please stop at the lab.  We will schedule another appointment if the labs are abnormal.   Hyperthyroidism  Hyperthyroidism is when the thyroid gland is too active (overactive). The thyroid gland is a small gland located in the lower front part of the neck, just in front of the windpipe (trachea). This gland makes hormones that help control how the body uses food for energy (metabolism) as well as how the heart and brain function. These hormones also play a role in keeping your bones strong. When the thyroid is overactive, it produces too much of a hormone called thyroxine. What are the causes? This condition may be caused by:  Graves' disease. This is a disorder in which the body's disease-fighting system (immune system) attacks the thyroid gland. This is the most common cause.  Inflammation of the thyroid gland.  A tumor in the thyroid gland.  Use of certain medicines, including: ? Prescription thyroid hormone replacement. ? Herbal supplements that mimic thyroid hormones. ? Amiodarone therapy.  Solid or fluid-filled lumps within your thyroid gland (thyroid nodules).  Taking in a large amount of iodine from foods or medicines. What increases the risk? You are more likely to develop this condition if:  You are female.  You have a family history of thyroid conditions.  You smoke tobacco.  You use a medicine called lithium.  You take medicines that affect the immune system (immunosuppressants). What are the signs or symptoms? Symptoms of this condition include:  Nervousness.  Inability to tolerate heat.  Unexplained weight loss.  Diarrhea.  Change in the texture of hair or skin.  Heart skipping beats or making extra beats.  Rapid heart rate.  Loss of menstruation.  Shaky hands.  Fatigue.  Restlessness.  Sleep problems.  Enlarged thyroid gland or a lump in the thyroid (nodule). You may also have symptoms of Graves' disease, which may include:  Protruding  eyes.  Dry eyes.  Red or swollen eyes.  Problems with vision. How is this diagnosed? This condition may be diagnosed based on:  Your symptoms and medical history.  A physical exam.  Blood tests.  Thyroid ultrasound. This test involves using sound waves to produce images of the thyroid gland.  A thyroid scan. A radioactive substance is injected into a vein, and images show how much iodine is present in the thyroid.  Radioactive iodine uptake test (RAIU). A small amount of radioactive iodine is given by mouth to see how much iodine the thyroid absorbs after a certain amount of time. How is this treated? Treatment depends on the cause and severity of the condition. Treatment may include:  Medicines to reduce the amount of thyroid hormone your body makes.  Radioactive iodine treatment (radioiodine therapy). This involves swallowing a small dose of radioactive iodine, in capsule or liquid form, to kill thyroid cells.  Surgery to remove part or all of your thyroid gland. You may need to take thyroid hormone replacement medicine for the rest of your life after thyroid surgery.  Medicines to help manage your symptoms. Follow these instructions at home:  Take over-the-counter and prescription medicines only as told by your health care provider.  Do not use any products that contain nicotine or tobacco, such as cigarettes and e-cigarettes. If you need help quitting, ask your health care provider.  Follow any instructions from your health care provider about diet. You may be instructed to limit foods that contain iodine.  Keep all follow-up visits as told by your health care provider. This is  important. ? You will need to have blood tests regularly so that your health care provider can monitor your condition.   Contact a health care provider if:  Your symptoms do not get better with treatment.  You have a fever.  You are taking thyroid hormone replacement medicine and you: ? Have  symptoms of depression. ? Feel like you are tired all the time. ? Gain weight. Get help right away if:  You have chest pain.  You have decreased alertness or a change in your awareness.  You have abdominal pain.  You feel dizzy.  You have a rapid heartbeat.  You have an irregular heartbeat.  You have difficulty breathing. Summary  The thyroid gland is a small gland located in the lower front part of the neck, just in front of the windpipe (trachea).  Hyperthyroidism is when the thyroid gland is too active (overactive) and produces too much of a hormone called thyroxine.  The most common cause is Graves' disease, a disorder in which your immune system attacks the thyroid gland.  Hyperthyroidism can cause various symptoms, such as unexplained weight loss, nervousness, inability to tolerate heat, or changes in your heartbeat.  Treatment may include medicine to reduce the amount of thyroid hormone your body makes, radioiodine therapy, surgery, or medicines to manage symptoms. This information is not intended to replace advice given to you by your health care provider. Make sure you discuss any questions you have with your health care provider. Document Revised: 11/14/2019 Document Reviewed: 11/14/2019 Elsevier Patient Education  2021 [employer].

## 2020-07-23 NOTE — Progress Notes (Signed)
Patient ID: Kathy Merritt, female   DOB: 1970-12-24, 50 y.o.   MRN: [id number]   This visit occurred during the SARS-CoV-2 public health emergency.  Safety protocols were in place, including screening questions prior to the visit, additional usage of staff PPE, and extensive cleaning of exam room while observing appropriate contact time as indicated for disinfecting solutions.   HPI  Kathy Merritt is a 50 y.o.-year-old female, referred by her PCP, Kathy Brine, FNP, for evaluation and management of a low TSH.  She describes that 21 years ago, after the birth of her son (2001) >> TSH was a little abnormal, but she could not follow up for this 2/2 loss of insurance.  She only recently obtained insurance and establish care with a PCP.  A TSH returned slightly low.  She just completed a 40 day liquid fast (for religious reasons) >> lost ~25 lbs. she feels much better.  I reviewed pt's thyroid tests: Lab Results  Component Value Date   TSH 0.358 (L) 05/20/2020   Antithyroid antibodies: No results found for: TSI  Pt denies: - feeling nodules in neck - hoarseness - dysphagia - choking - SOB with lying down  She mentions: - + fatigue - + hair loss - + previously weight gain, but now weight loss doing her weekly fast - + hot flushes  She denies: - excessive sweating/heat intolerance - tremors - anxiety - palpitations - hyperdefecation  Pt does have a FH of thyroid ds. In mother - Hyperthyroidism. No FH of thyroid cancer. No h/o radiation tx to head or neck.  She was on Iodine off and on iodine supplements in the last 2 years. Last dose several months ago.   She is drinking Ashwaganda tea now, but she remembers taking tablets before the last thyroid tests returned.   She is on 5000 mcg of biotin daily. Last dose >1 week ago.  No seaweed or kelp, no recent contrast studies.   No recent steroid use.   Pt. also has a history of irregular menstrual cycles >> LMP  04/2015, also, high ferritin, prediabetes-PCP recommended dietary changes, increase physical activity (latest HbA1c from 05/20/2020: 6.2%) -she started her fast afterwards. Also has anemia.  She exercises by walking +/- jogging 1-3 times a week.  ROS: + see HPI Constitutional: + see HPI Eyes: no blurry vision, no xerophthalmia ENT: no sore throat, + see HPI Cardiovascular: no CP/SOB/palpitations/leg swelling Respiratory: no cough/SOB Gastrointestinal: no N/V/D/C Musculoskeletal: no muscle/joint aches Skin: no rashes, + itching (resolved) Neurological: no tremors/numbness/tingling/dizziness Psychiatric: no depression/anxiety  History reviewed. No pertinent past medical history. Past Surgical History:  Procedure Laterality Date  . [place], 2001   x 2   Social History   Socioeconomic History  . Marital status: Divorced    Spouse name: Not on file  . Number of children: 2  . Years of education: Not on file  . Highest education level: Not on file  Occupational History  . Occupation: Clinic front office rep  Tobacco Use  . Smoking status: Never Smoker  . Smokeless tobacco: Never Used  Substance and Sexual Activity  . Alcohol use: Never  . Drug use: No  . Sexual activity: Not on file  Other Topics Concern  . Not on file  Social History Narrative  . Not on file   Social Determinants of Health   Financial Resource Strain: Not on file  Food Insecurity: Not on file  Transportation Needs: Not on file  Physical Activity: Not  on file  Stress: Not on file  Social Connections: Not on file  Intimate Partner Violence: Not on file   Current Outpatient Medications on File Prior to Visit  Medication Sig Dispense Refill  . ascorbic acid (VITAMIN C) 250 MG CHEW Chew 250 mg by mouth daily.    Marland Kitchen b complex vitamins capsule Take 1 capsule by mouth daily.    . cholecalciferol (VITAMIN D) 25 MCG (1000 UNIT) tablet Take 1,000 Units by mouth daily. Takes 2-3 gummies every  other day    . Cyanocobalamin (VITAMIN B-12) 500 MCG SUBL Place under the tongue.    . magnesium 30 MG tablet Take 30 mg by mouth 2 (two) times daily.    . Multiple Vitamins-Minerals (MULTIVITAMIN WITH MINERALS) tablet Take 1 tablet by mouth daily.    . Nutritional Supplements (SILICA) 30.1 MG CAPS Take 1 capsule (12.5 mg total) by mouth daily. 30 capsule 0   No current facility-administered medications on file prior to visit.   Allergies  Allergen Reactions  . Pine Shortness Of Breath    wheezing  . Latex Itching and Rash   Family History  Problem Relation Age of Onset  . Healthy Mother   . Diabetes Father   . Hypertension Father   . Cancer Father        liver and gallbladder  . Dementia Maternal Grandmother     PE: BP 128/90 (BP Location: Right Arm, Patient Position: Sitting, Cuff Size: Normal)   Pulse 78   Ht 5' 4.2" (1.631 m)   Wt 169 lb 9.6 oz (76.9 kg)   LMP 07/18/2013   SpO2 97%   BMI 28.93 kg/m  Wt Readings from Last 3 Encounters:  07/23/20 169 lb 9.6 oz (76.9 kg)  05/20/20 192 lb 6.4 oz (87.3 kg)   Constitutional: overweight, in NAD Eyes: PERRLA, EOMI, no exophthalmos, no lid lag, no stare ENT: moist mucous membranes, no thyromegaly, no thyroid bruits, no cervical lymphadenopathy Cardiovascular: RRR, No MRG Respiratory: CTA B Gastrointestinal: abdomen soft, NT, ND, BS+ Musculoskeletal: no deformities, strength intact in all 4 Skin: moist, warm, no rashes Neurological: no tremor with outstretched hands, DTR normal in all 4  ASSESSMENT: 1.  Low TSH  PLAN:  1. Patient with a recently found low TSH, without thyrotoxic sxs: No unintentional weight loss, heat intolerance (except for hot flashes as expected during perimenopause/menopause), hyperdefecation, palpitations, anxiety.  - she has several possible exogenous causes for her low TSH obtained in 05/2020: Iodine, biotin, ashwagandha.  I advised her to stay away from iodine and ashwagandha and also to stop  biotin at least a week prior to hormonal labs.  She has now been off biotin for approximately 10 days so we can recheck her tests. - We discussed that possible endogenous causes of thyrotoxicosis are:  Marland Kitchen Graves ds   . Thyroiditis . toxic multinodular goiter/ toxic adenoma (I cannot feel nodules at palpation of her thyroid). - will check the TSH, fT3 and fT4 and also add thyroid stimulating antibodies to screen for Graves' disease.  - If the tests remain abnormal, we may need an uptake and scan to differentiate between the 3 above possible etiologies  - we discussed about possible modalities of treatment for the above conditions, to include methimazole use, radioactive iodine ablation or (last resort) surgery. - we may need to do thyroid ultrasound depending on the results of the uptake and scan (if a cold nodule is present) - I do not feel that we need to  add beta blockers at this time, since she is not tachycardic or tremulous - no signs of Graves' ophthalmopathy: she does not have any double vision, blurry vision, eye pain, chemosis. - RTC on an as-needed basis, unless her TFTs return abnormal  Component     Latest Ref Rng & Units 07/23/2020  TSH     0.35 - 4.50 uIU/mL 0.24 (L)  T4,Free(Direct)     0.60 - 1.60 ng/dL 0.77  Triiodothyronine,Free,Serum     2.3 - 4.2 pg/mL 3.1  TSI     <140 % baseline <89   TSI's are undetectable so no evident Graves ds. Free thyroid hormones are normal, while TSH is slightly lower.  I would suggest to repeat the TFTs in 5 weeks and if TSH still low >> we can proceed with a thyroid uptake and scan.  Philemon Kingdom, MD PhD [facility] Endocrinology

## 2020-07-24 DIAGNOSIS — H524 Presbyopia: Secondary | ICD-10-CM | POA: Diagnosis not present

## 2020-07-25 DIAGNOSIS — Z20822 Contact with and (suspected) exposure to covid-19: Secondary | ICD-10-CM | POA: Diagnosis not present

## 2020-07-27 LAB — THYROID STIMULATING IMMUNOGLOBULIN: TSI: <89 % baseline (ref <140)

## 2020-08-13 ENCOUNTER — Encounter: Payer: 59 | Admitting: Nurse Practitioner

## 2020-08-19 ENCOUNTER — Encounter: Payer: 59 | Admitting: Nurse Practitioner

## 2020-08-25 ENCOUNTER — Other Ambulatory Visit: Payer: Self-pay | Admitting: Nurse Practitioner

## 2020-08-25 ENCOUNTER — Ambulatory Visit (INDEPENDENT_AMBULATORY_CARE_PROVIDER_SITE_OTHER): Payer: 59 | Admitting: Nurse Practitioner

## 2020-08-25 ENCOUNTER — Other Ambulatory Visit: Payer: Self-pay

## 2020-08-25 ENCOUNTER — Encounter: Payer: Self-pay | Admitting: Nurse Practitioner

## 2020-08-25 VITALS — BP 112/80 | HR 63 | Wt 174.6 lb

## 2020-08-25 DIAGNOSIS — R7309 Other abnormal glucose: Secondary | ICD-10-CM | POA: Diagnosis not present

## 2020-08-25 DIAGNOSIS — R2242 Localized swelling, mass and lump, left lower limb: Secondary | ICD-10-CM

## 2020-08-25 DIAGNOSIS — Z0000 Encounter for general adult medical examination without abnormal findings: Secondary | ICD-10-CM | POA: Diagnosis not present

## 2020-08-25 DIAGNOSIS — Z13228 Encounter for screening for other metabolic disorders: Secondary | ICD-10-CM | POA: Diagnosis not present

## 2020-08-25 DIAGNOSIS — Z862 Personal history of diseases of the blood and blood-forming organs and certain disorders involving the immune mechanism: Secondary | ICD-10-CM

## 2020-08-25 DIAGNOSIS — R5383 Other fatigue: Secondary | ICD-10-CM

## 2020-08-25 DIAGNOSIS — Z114 Encounter for screening for human immunodeficiency virus [HIV]: Secondary | ICD-10-CM

## 2020-08-25 DIAGNOSIS — Z1211 Encounter for screening for malignant neoplasm of colon: Secondary | ICD-10-CM | POA: Diagnosis not present

## 2020-08-25 DIAGNOSIS — Z8616 Personal history of COVID-19: Secondary | ICD-10-CM

## 2020-08-25 MED ORDER — SHINGRIX 50 MCG/0.5ML IM SUSR: 0.5000 mL | Freq: Once | INTRAMUSCULAR | 0 refills | Status: AC

## 2020-08-25 NOTE — Patient Instructions (Signed)
Health Maintenance, Female Adopting a healthy lifestyle and getting preventive care are important in promoting health and wellness. Ask your health care provider about: The right schedule for you to have regular tests and exams. Things you can do on your own to prevent diseases and keep yourself healthy. What should I know about diet, weight, and exercise? Eat a healthy diet  Eat a diet that includes plenty of vegetables, fruits, low-fat dairy products, and lean protein. Do not eat a lot of foods that are high in solid fats, added sugars, or sodium.  Maintain a healthy weight Body mass index (BMI) is used to identify weight problems. It estimates body fat based on height and weight. Your health care provider can help determineyour BMI and help you achieve or maintain a healthy weight. Get regular exercise Get regular exercise. This is one of the most important things you can do for your health. Most adults should: Exercise for at least 150 minutes each week. The exercise should increase your heart rate and make you sweat (moderate-intensity exercise). Do strengthening exercises at least twice a week. This is in addition to the moderate-intensity exercise. Spend less time sitting. Even light physical activity can be beneficial. Watch cholesterol and blood lipids Have your blood tested for lipids and cholesterol at 50 years of age, then havethis test every 5 years. Have your cholesterol levels checked more often if: Your lipid or cholesterol levels are high. You are older than 50 years of age. You are at high risk for heart disease. What should I know about cancer screening? Depending on your health history and family history, you may need to have cancer screening at various ages. This may include screening for: Breast cancer. Cervical cancer. Colorectal cancer. Skin cancer. Lung cancer. What should I know about heart disease, diabetes, and high blood pressure? Blood pressure and heart  disease High blood pressure causes heart disease and increases the risk of stroke. This is more likely to develop in people who have high blood pressure readings, are of African descent, or are overweight. Have your blood pressure checked: Every 3-5 years if you are 18-39 years of age. Every year if you are 40 years old or older. Diabetes Have regular diabetes screenings. This checks your fasting blood sugar level. Have the screening done: Once every three years after age 40 if you are at a normal weight and have a low risk for diabetes. More often and at a younger age if you are overweight or have a high risk for diabetes. What should I know about preventing infection? Hepatitis B If you have a higher risk for hepatitis B, you should be screened for this virus. Talk with your health care provider to find out if you are at risk forhepatitis B infection. Hepatitis C Testing is recommended for: Everyone born from 1945 through 1965. Anyone with known risk factors for hepatitis C. Sexually transmitted infections (STIs) Get screened for STIs, including gonorrhea and chlamydia, if: You are sexually active and are younger than 50 years of age. You are older than 50 years of age and your health care provider tells you that you are at risk for this type of infection. Your sexual activity has changed since you were last screened, and you are at increased risk for chlamydia or gonorrhea. Ask your health care provider if you are at risk. Ask your health care provider about whether you are at high risk for HIV. Your health care provider may recommend a prescription medicine to help   prevent HIV infection. If you choose to take medicine to prevent HIV, you should first get tested for HIV. You should then be tested every 3 months for as long as you are taking the medicine. Pregnancy If you are about to stop having your period (premenopausal) and you may become pregnant, seek counseling before you get  pregnant. Take 400 to 800 micrograms (mcg) of folic acid every day if you become pregnant. Ask for birth control (contraception) if you want to prevent pregnancy. Osteoporosis and menopause Osteoporosis is a disease in which the bones lose minerals and strength with aging. This can result in bone fractures. If you are 65 years old or older, or if you are at risk for osteoporosis and fractures, ask your health care provider if you should: Be screened for bone loss. Take a calcium or vitamin D supplement to lower your risk of fractures. Be given hormone replacement therapy (HRT) to treat symptoms of menopause. Follow these instructions at home: Lifestyle Do not use any products that contain nicotine or tobacco, such as cigarettes, e-cigarettes, and chewing tobacco. If you need help quitting, ask your health care provider. Do not use street drugs. Do not share needles. Ask your health care provider for help if you need support or information about quitting drugs. Alcohol use Do not drink alcohol if: Your health care provider tells you not to drink. You are pregnant, may be pregnant, or are planning to become pregnant. If you drink alcohol: Limit how much you use to 0-1 drink a day. Limit intake if you are breastfeeding. Be aware of how much alcohol is in your drink. In the [place] one drink equals one 12 oz bottle of beer (355 mL), one 5 oz glass of wine (148 mL), or one 1 oz glass of hard liquor (44 mL). General instructions Schedule regular health, dental, and eye exams. Stay current with your vaccines. Tell your health care provider if: You often feel depressed. You have ever been abused or do not feel safe at home. Summary Adopting a healthy lifestyle and getting preventive care are important in promoting health and wellness. Follow your health care provider's instructions about healthy diet, exercising, and getting tested or screened for diseases. Follow your health care provider's  instructions on monitoring your cholesterol and blood pressure. This information is not intended to replace advice given to you by your health care provider. Make sure you discuss any questions you have with your healthcare provider. Document Revised: 02/21/2018 Document Reviewed: 02/21/2018 Elsevier Patient Education  2022 Elsevier Inc.  

## 2020-08-25 NOTE — Progress Notes (Signed)
I,Yamilka Roman [employer] as a Neurosurgeon for [employer], FNP.,have documented all relevant documentation on the behalf of Kathy Felts, FNP,as directed by  Kathy Felts, FNP while in the presence of Kathy Felts, FNP.  This visit occurred during the SARS-CoV-2 public health emergency.  Safety protocols were in place, including screening questions prior to the visit, additional usage of staff PPE, and extensive cleaning of exam room while observing appropriate contact time as indicated for disinfecting solutions.  Subjective:     Patient ID: Kathy Merritt , female    DOB: 03-02-1971 , 50 y.o.   MRN: [id number]   Chief Complaint  Patient presents with   Annual Exam    HPI  Patient here for HM. She is scheduled to see a GYN - Dr. Belva Agee ([facility]) for a new patient appt on Thursday. She has not had a menstrual cycle since 2017.   Wt Readings from Last 3 Encounters: 08/25/20 : 174 lb 9.6 oz (79.2 kg) 07/23/20 : 169 lb 9.6 oz (76.9 kg) 05/20/20 : 192 lb 6.4 oz (87.3 kg)  She has been doing a 40 day fast for spiritual purposes. Her lowest weight was 159 lbs.  She had covid in January and mostly had fatigue.     No past medical history on file.   Family History  Problem Relation Age of Onset   Healthy Mother    Diabetes Father    Hypertension Father    Cancer Father        liver and gallbladder   Dementia Maternal Grandmother      Current Outpatient Medications:    b complex vitamins capsule, Take 1 capsule by mouth daily., Disp: , Rfl:    cholecalciferol (VITAMIN D) 25 MCG (1000 UNIT) tablet, Take 1,000 Units by mouth daily. Takes 2-3 gummies every other day, Disp: , Rfl:    Multiple Vitamins-Minerals (MULTIVITAMIN WITH MINERALS) tablet, Take 1 tablet by mouth daily., Disp: , Rfl:    ascorbic acid (VITAMIN C) 250 MG CHEW, Chew 250 mg by mouth daily. (Patient not taking: Reported on 08/25/2020), Disp: , Rfl:    Cyanocobalamin (VITAMIN B-12) 500 MCG SUBL,  Place under the tongue. (Patient not taking: Reported on 08/25/2020), Disp: , Rfl:    magnesium 30 MG tablet, Take 30 mg by mouth 2 (two) times daily. (Patient not taking: Reported on 08/25/2020), Disp: , Rfl:    Nutritional Supplements (SILICA) 12.5 MG CAPS, Take 1 capsule (12.5 mg total) by mouth daily. (Patient not taking: Reported on 08/25/2020), Disp: 30 capsule, Rfl: 0   Allergies  Allergen Reactions   Pine Shortness Of Breath    wheezing   Latex Itching and Rash      The patient states she uses none for birth control. Last LMP was Patient's last menstrual period was 07/18/2013.. Negative for Dysmenorrhea and Negative for Menorrhagia. Negative for: breast discharge, breast lump(s), breast pain and breast self exam. Associated symptoms include abnormal vaginal bleeding. Pertinent negatives include abnormal bleeding (hematology), anxiety, decreased libido, depression, difficulty falling sleep, dyspareunia, history of infertility, nocturia, sexual dysfunction, sleep disturbances, urinary incontinence, urinary urgency, vaginal discharge and vaginal itching. Diet regular; she will eat some sweets but not tolerating her sugar as well.  She is using Hello toothpaste. She does not eat a lot of meat, and trying to eat in moderation. She is trying to limit her carb intake. The patient states her exercise level is    The patient's tobacco use is:  Social History  Tobacco Use  Smoking Status Never  Smokeless Tobacco Never   She has been exposed to passive smoke. The patient's alcohol use is:  Social History   Substance and Sexual Activity  Alcohol Use Never   Additional information: Last pap unknown, she has a new patient appt for PAP on Thursday.   Review of Systems  Constitutional: Negative.   HENT: Negative.    Eyes: Negative.   Respiratory: Negative.    Cardiovascular:  Positive for leg swelling (left lower leg swelling).  Gastrointestinal: Negative.   Endocrine: Negative.    Genitourinary: Negative.   Musculoskeletal: Negative.   Skin: Negative.   Allergic/Immunologic: Negative.   Neurological:  Positive for dizziness. Negative for light-headedness and headaches.  Hematological: Negative.   Psychiatric/Behavioral: Negative.      Today's Vitals   08/25/20 1203  BP: 112/80  Pulse: 63  Weight: 174 lb 9.6 oz (79.2 kg)  PainSc: 0-No pain   Body mass index is 29.78 kg/m.   Objective:  Physical Exam Vitals reviewed.  Constitutional:      General: She is not in acute distress.    Appearance: Normal appearance. She is well-developed. She is obese.  HENT:     Head: Normocephalic and atraumatic.     Right Ear: Hearing, tympanic membrane, ear canal and external ear normal. There is no impacted cerumen.     Left Ear: Hearing, tympanic membrane, ear canal and external ear normal. There is no impacted cerumen.     Nose:     Comments: Deferred - masked    Mouth/Throat:     Comments: Deferred - masked Eyes:     General: Lids are normal.     Extraocular Movements: Extraocular movements intact.     Conjunctiva/sclera: Conjunctivae normal.     Pupils: Pupils are equal, round, and reactive to light.     Funduscopic exam:    Right eye: No papilledema.        Left eye: No papilledema.  Neck:     Thyroid: No thyroid mass.     Vascular: No carotid bruit.  Cardiovascular:     Rate and Rhythm: Normal rate and regular rhythm.     Pulses: Normal pulses.     Heart sounds: Normal heart sounds. No murmur heard. Pulmonary:     Effort: Pulmonary effort is normal.     Breath sounds: Normal breath sounds.  Chest:     Chest wall: No mass.  Breasts:    Tanner Score is 5.     Right: Normal. No mass, tenderness, axillary adenopathy or supraclavicular adenopathy.     Left: Normal. No mass, tenderness, axillary adenopathy or supraclavicular adenopathy.  Abdominal:     General: Abdomen is flat. Bowel sounds are normal. There is no distension.     Palpations: Abdomen is  soft.     Tenderness: There is no abdominal tenderness.  Genitourinary:    Rectum: Guaiac result negative.  Musculoskeletal:        General: No swelling. Normal range of motion.     Cervical back: Full passive range of motion without pain, normal range of motion and neck supple.     Right lower leg: No edema.     Left lower leg: No edema.  Lymphadenopathy:     Upper Body:     Right upper body: No supraclavicular, axillary or pectoral adenopathy.     Left upper body: No supraclavicular, axillary or pectoral adenopathy.  Skin:    General: Skin is warm and  dry.     Capillary Refill: Capillary refill takes less than 2 seconds.  Neurological:     General: No focal deficit present.     Mental Status: She is alert and oriented to person, place, and time.     Cranial Nerves: No cranial nerve deficit.     Sensory: No sensory deficit.     Motor: No weakness.  Psychiatric:        Mood and Affect: Mood normal.        Behavior: Behavior normal.        Thought Content: Thought content normal.        Judgment: Judgment normal.        Assessment And Plan:     1. Encounter for general adult medical examination w/o abnormal findings Behavior modifications discussed and diet history reviewed.   Pt will continue to exercise regularly and modify diet with low GI, plant based foods and decrease intake of processed foods.  Recommend intake of daily multivitamin, Vitamin D, and calcium.  Recommend mammogram (up to date) and colonoscopy for preventive screenings, as well as recommend immunizations that include influenza, TDAP, and Shingles (sent to pharmacy) - CBC  2. Encounter for screening for human immunodeficiency virus (HIV) - HIV Antibody (routine testing w rflx)  3. Screening for colon cancer According to USPTF Colorectal cancer Screening guidelines. Colonoscopy is recommended every 10 years, starting at age 71 years. Will refer to GI for colon cancer screening. - Ambulatory referral to  Gastroenterology  4. History of anemia  5. History of COVID-19 Overall doing well, no infusion  6. Localized swelling of left lower extremity Mild lower extremity swelling will check for blood clot least likely due to not having been on any long trips - VAS [place] LOWER EXTREMITY VENOUS (DVT); Future  7. Abnormal glucose Chronic, stable No current medications Encouraged to limit intake of sugary foods and drinks Encouraged to increase physical activity to 150 minutes per week - CMP14+EGFR - Hemoglobin A1c  8. Encounter for screening for metabolic disorder - Lipid panel  9. Fatigue, unspecified type Will check vitamin B12  Encouraged to exercise regularly as this may help increase energy level - Vitamin B12    Patient was given opportunity to ask questions. Patient verbalized understanding of the plan and was able to repeat key elements of the plan. All questions were answered to their satisfaction.   Kathy Brine, FNP   I, Kathy Brine, FNP, have reviewed all documentation for this visit. The documentation on 08/25/20 for the exam, diagnosis, procedures, and orders are all accurate and complete.   THE PATIENT IS ENCOURAGED TO PRACTICE SOCIAL DISTANCING DUE TO THE COVID-19 PANDEMIC.

## 2020-08-26 ENCOUNTER — Ambulatory Visit (HOSPITAL_COMMUNITY)
Admission: RE | Admit: 2020-08-26 | Discharge: 2020-08-26 | Disposition: A | Discharge status: Home / Self Care | Payer: 59 | Source: Ambulatory Visit | Attending: Nurse Practitioner | Admitting: Nurse Practitioner

## 2020-08-26 ENCOUNTER — Other Ambulatory Visit: Payer: Self-pay

## 2020-08-26 DIAGNOSIS — R2242 Localized swelling, mass and lump, left lower limb: Secondary | ICD-10-CM | POA: Diagnosis not present

## 2020-08-26 LAB — CMP14+EGFR
ALT: 11 IU/L (ref 0–32)
AST: 8 IU/L (ref 0–40)
Albumin/Globulin Ratio: 1.5 (ref 1.2–2.2)
Albumin: 4.4 g/dL (ref 3.8–4.8)
Alkaline Phosphatase: 62 IU/L (ref 44–121)
BUN/Creatinine Ratio: 12 (ref 9–23)
BUN: 8 mg/dL (ref 6–24)
Bilirubin Total: 0.4 mg/dL (ref 0.0–1.2)
CO2: 24 mmol/L (ref 20–29)
Calcium: 9.4 mg/dL (ref 8.7–10.2)
Chloride: 105 mmol/L (ref 96–106)
Creatinine, Ser: 0.67 mg/dL (ref 0.57–1.00)
Globulin, Total: 2.9 g/dL (ref 1.5–4.5)
Glucose: 82 mg/dL (ref 65–99)
Potassium: 5 mmol/L (ref 3.5–5.2)
Sodium: 143 mmol/L (ref 134–144)
Total Protein: 7.3 g/dL (ref 6.0–8.5)
eGFR: 107 mL/min/{1.73_m2} (ref >59)

## 2020-08-26 LAB — CBC
Hematocrit: 32.7 % — ABNORMAL LOW (ref 34.0–46.6)
Hemoglobin: 10.5 g/dL — ABNORMAL LOW (ref 11.1–15.9)
MCH: 27.6 pg (ref 26.6–33.0)
MCHC: 32.1 g/dL (ref 31.5–35.7)
MCV: 86 fL (ref 79–97)
Platelets: 353 10*3/uL (ref 150–450)
RBC: 3.8 x10E6/uL (ref 3.77–5.28)
RDW: 14.1 % (ref 11.7–15.4)
WBC: 6 10*3/uL (ref 3.4–10.8)

## 2020-08-26 LAB — HIV ANTIBODY (ROUTINE TESTING W REFLEX): HIV Screen 4th Generation wRfx: NONREACTIVE

## 2020-08-26 LAB — LIPID PANEL
Chol/HDL Ratio: 3.3 ratio (ref 0.0–4.4)
Cholesterol, Total: 162 mg/dL (ref 100–199)
HDL: 49 mg/dL (ref >39)
LDL Chol Calc (NIH): 96 mg/dL (ref 0–99)
Triglycerides: 90 mg/dL (ref 0–149)
VLDL Cholesterol Cal: 17 mg/dL (ref 5–40)

## 2020-08-26 LAB — VITAMIN B12: Vitamin B-12: 565 pg/mL (ref 232–1245)

## 2020-08-26 LAB — HEMOGLOBIN A1C
Est. average glucose Bld gHb Est-mCnc: 123 mg/dL
Hgb A1c MFr Bld: 5.9 % — ABNORMAL HIGH (ref 4.8–5.6)

## 2020-08-27 ENCOUNTER — Encounter: Payer: Self-pay | Admitting: Nurse Practitioner

## 2020-08-27 DIAGNOSIS — Z6829 Body mass index (BMI) 29.0-29.9, adult: Secondary | ICD-10-CM | POA: Diagnosis not present

## 2020-08-27 DIAGNOSIS — L678 Other hair color and hair shaft abnormalities: Secondary | ICD-10-CM | POA: Diagnosis not present

## 2020-08-27 DIAGNOSIS — Z01419 Encounter for gynecological examination (general) (routine) without abnormal findings: Secondary | ICD-10-CM | POA: Diagnosis not present

## 2020-08-28 ENCOUNTER — Telehealth: Payer: Self-pay | Admitting: Physician Assistant

## 2020-08-28 NOTE — Telephone Encounter (Signed)
Scheduled appt per 6/16 referral. Pt aware.

## 2020-09-02 LAB — IRON AND TIBC
Iron Saturation: 22 % (ref 15–55)
Iron: 68 ug/dL (ref 27–159)
Total Iron Binding Capacity: 316 ug/dL (ref 250–450)
UIBC: 248 ug/dL (ref 131–425)

## 2020-09-02 LAB — SPECIMEN STATUS REPORT

## 2020-09-03 ENCOUNTER — Encounter: Payer: Self-pay | Admitting: Nurse Practitioner

## 2020-09-04 ENCOUNTER — Other Ambulatory Visit: Payer: Self-pay

## 2020-09-04 ENCOUNTER — Other Ambulatory Visit (INDEPENDENT_AMBULATORY_CARE_PROVIDER_SITE_OTHER): Payer: 59

## 2020-09-04 ENCOUNTER — Other Ambulatory Visit: Payer: Self-pay | Admitting: Internal Medicine

## 2020-09-04 DIAGNOSIS — R7989 Other specified abnormal findings of blood chemistry: Secondary | ICD-10-CM

## 2020-09-04 LAB — T3, FREE: T3, Free: 3.7 pg/mL (ref 2.3–4.2)

## 2020-09-04 LAB — T4, FREE: Free T4: 0.74 ng/dL (ref 0.60–1.60)

## 2020-09-04 LAB — TSH: TSH: 0.31 u[IU]/mL — ABNORMAL LOW (ref 0.35–4.50)

## 2020-09-07 ENCOUNTER — Encounter: Payer: Self-pay | Admitting: Nurse Practitioner

## 2020-09-15 ENCOUNTER — Other Ambulatory Visit: Payer: Self-pay

## 2020-09-15 ENCOUNTER — Inpatient Hospital Stay: Payer: 59 | Attending: Physician Assistant | Admitting: Physician Assistant

## 2020-09-15 ENCOUNTER — Inpatient Hospital Stay: Payer: 59

## 2020-09-15 ENCOUNTER — Encounter: Payer: Self-pay | Admitting: Physician Assistant

## 2020-09-15 VITALS — BP 132/87 | HR 83 | Temp 98.2°F | Resp 18 | Wt 175.9 lb

## 2020-09-15 DIAGNOSIS — D649 Anemia, unspecified: Secondary | ICD-10-CM | POA: Diagnosis not present

## 2020-09-15 DIAGNOSIS — I499 Cardiac arrhythmia, unspecified: Secondary | ICD-10-CM | POA: Insufficient documentation

## 2020-09-15 LAB — CBC WITH DIFFERENTIAL (CANCER CENTER ONLY)
Abs Immature Granulocytes: 0 10*3/uL (ref 0.00–0.07)
Basophils Absolute: 0 10*3/uL (ref 0.0–0.1)
Basophils Relative: 1 %
Eosinophils Absolute: 0.1 10*3/uL (ref 0.0–0.5)
Eosinophils Relative: 1 %
HCT: 33.1 % — ABNORMAL LOW (ref 36.0–46.0)
Hemoglobin: 10.6 g/dL — ABNORMAL LOW (ref 12.0–15.0)
Immature Granulocytes: 0 %
Lymphocytes Relative: 45 %
Lymphs Abs: 2 10*3/uL (ref 0.7–4.0)
MCH: 28 pg (ref 26.0–34.0)
MCHC: 32 g/dL (ref 30.0–36.0)
MCV: 87.6 fL (ref 80.0–100.0)
Monocytes Absolute: 0.2 10*3/uL (ref 0.1–1.0)
Monocytes Relative: 5 %
Neutro Abs: 2.2 10*3/uL (ref 1.7–7.7)
Neutrophils Relative %: 48 %
Platelet Count: 347 10*3/uL (ref 150–400)
RBC: 3.78 MIL/uL — ABNORMAL LOW (ref 3.87–5.11)
RDW: 14.7 % (ref 11.5–15.5)
WBC Count: 4.5 10*3/uL (ref 4.0–10.5)
nRBC: 0 % (ref 0.0–0.2)

## 2020-09-15 LAB — RETIC PANEL
Immature Retic Fract: 14.6 % (ref 2.3–15.9)
RBC.: 3.79 MIL/uL — ABNORMAL LOW (ref 3.87–5.11)
Retic Count, Absolute: 58 10*3/uL (ref 19.0–186.0)
Retic Ct Pct: 1.5 % (ref 0.4–3.1)
Reticulocyte Hemoglobin: 29.2 pg (ref >27.9)

## 2020-09-15 LAB — FOLATE: Folate: 5.9 ng/mL — ABNORMAL LOW (ref >5.9)

## 2020-09-15 LAB — IRON AND TIBC
Iron: 54 ug/dL (ref 41–142)
Saturation Ratios: 17 % — ABNORMAL LOW (ref 21–57)
TIBC: 319 ug/dL (ref 236–444)
UIBC: 265 ug/dL (ref 120–384)

## 2020-09-15 LAB — FERRITIN: Ferritin: 156 ng/mL (ref 11–307)

## 2020-09-15 NOTE — Progress Notes (Signed)
[place] [phone]   [phone] [place] NOTE  Patient Care Team: Minette Brine, FNP as PCP - General (General Practice)  Hematological/Oncological History 1) Labs from PCP, Minette Brine FNP -08/25/2020: WBC 6.0, Hgb 10.5 (L), MCV 86, Plt 353, Vitamin B12 565, TIBC 316, Iron 68, Iron Saturation 22%.   2) 09/15/2020: Establish care with Dede Query PA-C  CHIEF COMPLAINTS/PURPOSE OF CONSULTATION:  Normocytic Anemia  HISTORY OF PRESENTING ILLNESS:  Kathy Merritt 50 y.o. female with medical history significant for vitamin D deficiency and prediabetes. Patient is unaccompanied for this visit.   On exam today, Kathy Merritt reports chronic fatigue that has been present for most of her life. She is able to complete her ADLs on her own. She has a good appetite and tries to eat a balanced diet. She does eat meat except for pork due to GI intolerance. Patient denies any nausea, vomiting or abdominal pain. She notes chronic constipation and requires taking a stool softener daily. She has a bowel movement almost every day. She denies easy bruising or signs of bleeding. She noted one episode of "specks of black stool" after she completed a 40 day fast in the beginning of May. She denies subsequent episodes of melena. Patient reports improvement of  swelling in the left lower leg that she noticed earlier in the month. She underwent doppler US on 08/26/2020 that was negative for DVT. Patient reports intermittent episodes of irregular heart beat lasting only a few seconds. She denies any triggers for the irregular heart beat or other associating symptoms. Patient reports that she has experienced irregular heart beats throughout her life but it has been more frequent recently. She denies any fevers, chills, night sweats, shortness of breath, chest pain or cough. She has no other complaints. Rest of the 10 point ROS is below.   MEDICAL HISTORY:  Past  Medical History:  Diagnosis Date   Pre-diabetes    Vitamin D deficiency     SURGICAL HISTORY: Past Surgical History:  Procedure Laterality Date   CESAREAN SECTION  1995, 2001   x 2   TOOTH EXTRACTION     WISDOM TOOTH EXTRACTION      SOCIAL HISTORY: Social History   Socioeconomic History   Marital status: Divorced    Spouse name: Not on file   Number of children: 2   Years of education: Not on file   Highest education level: Not on file  Occupational History   Occupation: Clinic front office rep  Tobacco Use   Smoking status: Never   Smokeless tobacco: Never  Substance and Sexual Activity   Alcohol use: Never   Drug use: No   Sexual activity: Not on file  Other Topics Concern   Not on file  Social History Narrative   Not on file   Social Determinants of Health   Financial Resource Strain: Not on file  Food Insecurity: Not on file  Transportation Needs: Not on file  Physical Activity: Not on file  Stress: Not on file  Social Connections: Not on file  Intimate Partner Violence: Not on file    FAMILY HISTORY: Family History  Problem Relation Age of Onset   Healthy Mother    Diabetes Father    Hypertension Father    Cancer Father        liver and gallbladder   Dementia Maternal Grandmother     ALLERGIES:  is allergic to pine and latex.  MEDICATIONS:  Current Outpatient Medications  Medication Sig Dispense Refill   ascorbic acid (VITAMIN C) 250 MG CHEW Chew 250 mg by mouth daily.     b complex vitamins capsule Take 1 capsule by mouth daily.     cholecalciferol (VITAMIN D) 25 MCG (1000 UNIT) tablet Take 1,000 Units by mouth daily. Takes 2-3 gummies every other day     Cyanocobalamin (VITAMIN B-12) 500 MCG SUBL Place under the tongue.     magnesium 30 MG tablet Take 30 mg by mouth 2 (two) times daily.     Multiple Vitamins-Minerals (MULTIVITAMIN WITH MINERALS) tablet Take 1 tablet by mouth daily.     No current facility-administered medications for this  visit.    REVIEW OF SYSTEMS:   Constitutional: ( - ) fevers, ( - )  chills , ( - ) night sweats Eyes: ( - ) blurriness of vision, ( - ) double vision, ( - ) watery eyes Ears, nose, mouth, throat, and face: ( - ) mucositis, ( - ) sore throat Respiratory: ( - ) cough, ( - ) dyspnea, ( - ) wheezes Cardiovascular: ( - ) palpitation, ( - ) chest discomfort, ( - ) lower extremity swelling Gastrointestinal:  ( - ) nausea, ( - ) heartburn, ( - ) change in bowel habits Skin: ( - ) abnormal skin rashes Lymphatics: ( - ) new lymphadenopathy, ( - ) easy bruising Neurological: ( - ) numbness, ( - ) tingling, ( - ) new weaknesses Behavioral/Psych: ( - ) mood change, ( - ) new changes  All other systems were reviewed with the patient and are negative.  PHYSICAL EXAMINATION: ECOG PERFORMANCE STATUS: 1 - Symptomatic but completely ambulatory  Vitals:   09/15/20 0902  BP: 132/87  Pulse: 83  Resp: 18  Temp: 98.2 F (36.8 C)  SpO2: 100%   Filed Weights   09/15/20 0902  Weight: 175 lb 14.4 oz (79.8 kg)    GENERAL: well appearing African American female in NAD  SKIN: skin color, texture, turgor are normal, no rashes or significant lesions EYES: conjunctiva are pink and non-injected, sclera clear OROPHARYNX: no exudate, no erythema; lips, buccal mucosa, and tongue normal  NECK: supple, non-tender LYMPH:  no palpable lymphadenopathy in the cervical, axillary or supraclavicular lymph nodes.  LUNGS: clear to auscultation and percussion with normal breathing effort HEART: regular rate & rhythm and no murmurs and no lower extremity edema ABDOMEN: soft, non-tender, non-distended, normal bowel sounds Musculoskeletal: no cyanosis of digits and no clubbing  PSYCH: alert & oriented x 3, fluent speech NEURO: no focal motor/sensory deficits  LABORATORY DATA:  I have reviewed the data as listed CBC Latest Ref Rng & Units 08/25/2020 05/20/2020  WBC 3.4 - 10.8 x10E3/uL 6.0 6.8  Hemoglobin 11.1 - 15.9 g/dL  10.5(L) 11.5  Hematocrit 34.0 - 46.6 % 32.7(L) 35.5  Platelets 150 - 450 x10E3/uL 353 397    CMP Latest Ref Rng & Units 08/25/2020  Glucose 65 - 99 mg/dL 82  BUN 6 - 24 mg/dL 8  Creatinine 0.57 - 1.00 mg/dL 0.67  Sodium 134 - 144 mmol/L 143  Potassium 3.5 - 5.2 mmol/L 5.0  Chloride 96 - 106 mmol/L 105  CO2 20 - 29 mmol/L 24  Calcium 8.7 - 10.2 mg/dL 9.4  Total Protein 6.0 - 8.5 g/dL 7.3  Total Bilirubin 0.0 - 1.2 mg/dL 0.4  Alkaline Phos 44 - 121 IU/L 62  AST 0 - 40 IU/L 8  ALT 0 - 32 IU/L 11     ASSESSMENT &  PLAN Kathy Merritt is a 50 y.o. female presenting to the clinic for evaluation for normocytic anemia. Patient reports that having chronic mild anemia and was told to take oral iron supplementation. She is currently not taking oral iron supplementation. Recent labs from 08/25/2020 shows mild normocytic anemia. I recommend to further evaluate underlying cause with labs to check CBC, iron and TIBC, ferritin, folate, methylmalonic acid levels.   #Normocytic Anemia, etiology unknown: --Prior labs from 08/25/2020 revealed Hgb 10.5 (L), MCV 86, Vitamin B12 565, TIBC 316, Iron 68, Iron Saturation 22%.   --Currently not taking oral iron tablets and advised to hold until workup is complete.  --Labs today to check CBC, iron and TIBC, ferritin, folate, methylmalonic acid levels.  --RTC based on above lab evaluation.   #Irregular heart beat: --Regular rhythm and rate per physician exam findings today --Advised to follow up with PCP to further evaluate.   Orders Placed This Encounter  Procedures   CBC with Differential ([place] Only)    Standing Status:   Future    Number of Occurrences:   1    Standing Expiration Date:   09/15/2021   Iron and TIBC    Standing Status:   Future    Number of Occurrences:   1    Standing Expiration Date:   09/15/2021   Ferritin    Standing Status:   Future    Number of Occurrences:   1    Standing Expiration Date:   09/15/2021   Retic Panel     Standing Status:   Future    Number of Occurrences:   1    Standing Expiration Date:   09/15/2021   Methylmalonic acid, serum    Standing Status:   Future    Number of Occurrences:   1    Standing Expiration Date:   09/15/2021   Folate, Serum    Standing Status:   Future    Number of Occurrences:   1    Standing Expiration Date:   09/15/2021    All questions were answered. The patient knows to call the clinic with any problems, questions or concerns.  I have spent a total of 60 minutes minutes of face-to-face and non-face-to-face time, preparing to see the patient, obtaining and/or reviewing separately obtained history, performing a medically appropriate examination, counseling and educating the patient, ordering tests, documenting clinical information in the electronic health record, and care coordination.   Dede Query, PA-C Department of Hematology/Oncology [place] at [facility] Phone: [phone]

## 2020-09-16 ENCOUNTER — Telehealth: Payer: Self-pay

## 2020-09-16 NOTE — Telephone Encounter (Signed)
Call patient to ask about her irregular heart rate she mentioned to the hematologist provider.  If this is something new we would need to see her inthe office for an appt to do further testing and gather additional   I called pt and left her a vm to call the office [facility]

## 2020-09-17 ENCOUNTER — Encounter: Payer: Self-pay | Admitting: Physician Assistant

## 2020-09-17 ENCOUNTER — Other Ambulatory Visit: Payer: Self-pay | Admitting: Physician Assistant

## 2020-09-17 MED ORDER — FOLIC ACID 1 MG PO TABS: 1.0000 mg | ORAL_TABLET | Freq: Every day | ORAL | 3 refills | Status: DC

## 2020-09-17 NOTE — Progress Notes (Signed)
I sent a secure message through Epic to review lab results from 09/15/2020. Iron panel shows iron saturation of 17% consistent with iron deficiency and folate level was 5.9, slight deficiency. I recommend for patient to resume ferrous sulfate 325 mg once daily and start folic acid 1 mg once daily. I will send a prescription of folic acid to the pharmacy on file. Patient will return to the clinic in 3 months with repeat labs. I will confirm with patient if she had recent colonoscopy or under the care of gastroenterologist.

## 2020-09-18 LAB — METHYLMALONIC ACID, SERUM: Methylmalonic Acid, Quantitative: 89 nmol/L (ref 0–378)

## 2020-09-28 ENCOUNTER — Other Ambulatory Visit: Payer: Self-pay | Admitting: Family Medicine

## 2020-09-28 ENCOUNTER — Other Ambulatory Visit: Payer: Self-pay

## 2020-09-28 ENCOUNTER — Other Ambulatory Visit: Payer: Self-pay | Admitting: Nurse Practitioner

## 2020-09-28 ENCOUNTER — Ambulatory Visit
Admission: RE | Admit: 2020-09-28 | Discharge: 2020-09-28 | Disposition: A | Discharge status: Home / Self Care | Payer: 59 | Source: Ambulatory Visit | Attending: Family Medicine | Admitting: Family Medicine

## 2020-09-28 DIAGNOSIS — N63 Unspecified lump in unspecified breast: Secondary | ICD-10-CM

## 2020-09-28 DIAGNOSIS — M9905 Segmental and somatic dysfunction of pelvic region: Secondary | ICD-10-CM | POA: Diagnosis not present

## 2020-09-28 DIAGNOSIS — N6321 Unspecified lump in the left breast, upper outer quadrant: Secondary | ICD-10-CM | POA: Diagnosis not present

## 2020-09-28 DIAGNOSIS — M9901 Segmental and somatic dysfunction of cervical region: Secondary | ICD-10-CM | POA: Diagnosis not present

## 2020-09-28 DIAGNOSIS — M9903 Segmental and somatic dysfunction of lumbar region: Secondary | ICD-10-CM | POA: Diagnosis not present

## 2020-09-28 DIAGNOSIS — M9904 Segmental and somatic dysfunction of sacral region: Secondary | ICD-10-CM | POA: Diagnosis not present

## 2020-09-29 DIAGNOSIS — M9901 Segmental and somatic dysfunction of cervical region: Secondary | ICD-10-CM | POA: Diagnosis not present

## 2020-09-29 DIAGNOSIS — M9903 Segmental and somatic dysfunction of lumbar region: Secondary | ICD-10-CM | POA: Diagnosis not present

## 2020-09-29 DIAGNOSIS — M9905 Segmental and somatic dysfunction of pelvic region: Secondary | ICD-10-CM | POA: Diagnosis not present

## 2020-09-29 DIAGNOSIS — M9904 Segmental and somatic dysfunction of sacral region: Secondary | ICD-10-CM | POA: Diagnosis not present

## 2020-10-01 ENCOUNTER — Encounter: Payer: Self-pay | Admitting: Nurse Practitioner

## 2020-10-02 ENCOUNTER — Telehealth: Payer: Self-pay

## 2020-10-02 DIAGNOSIS — M9903 Segmental and somatic dysfunction of lumbar region: Secondary | ICD-10-CM | POA: Diagnosis not present

## 2020-10-02 DIAGNOSIS — M9904 Segmental and somatic dysfunction of sacral region: Secondary | ICD-10-CM | POA: Diagnosis not present

## 2020-10-02 DIAGNOSIS — M9901 Segmental and somatic dysfunction of cervical region: Secondary | ICD-10-CM | POA: Diagnosis not present

## 2020-10-02 DIAGNOSIS — M9905 Segmental and somatic dysfunction of pelvic region: Secondary | ICD-10-CM | POA: Diagnosis not present

## 2020-10-02 NOTE — Telephone Encounter (Signed)
Left the pt a message that I would have the referral specialist to check on the GI referral and for the pt to call the office back, Moore, NP said that the pt the pt can come to the office for the shingles vaccination.

## 2020-10-05 ENCOUNTER — Encounter: Payer: Self-pay | Admitting: Gastroenterology

## 2020-10-05 DIAGNOSIS — M9903 Segmental and somatic dysfunction of lumbar region: Secondary | ICD-10-CM | POA: Diagnosis not present

## 2020-10-05 DIAGNOSIS — M9904 Segmental and somatic dysfunction of sacral region: Secondary | ICD-10-CM | POA: Diagnosis not present

## 2020-10-05 DIAGNOSIS — M9905 Segmental and somatic dysfunction of pelvic region: Secondary | ICD-10-CM | POA: Diagnosis not present

## 2020-10-05 DIAGNOSIS — M9901 Segmental and somatic dysfunction of cervical region: Secondary | ICD-10-CM | POA: Diagnosis not present

## 2020-10-08 ENCOUNTER — Encounter: Payer: Self-pay | Admitting: Nurse Practitioner

## 2020-10-08 ENCOUNTER — Other Ambulatory Visit: Payer: 59

## 2020-10-08 ENCOUNTER — Other Ambulatory Visit: Payer: Self-pay

## 2020-10-08 ENCOUNTER — Ambulatory Visit: Payer: 59 | Admitting: Nurse Practitioner

## 2020-10-08 DIAGNOSIS — R0981 Nasal congestion: Secondary | ICD-10-CM | POA: Diagnosis not present

## 2020-10-08 DIAGNOSIS — J029 Acute pharyngitis, unspecified: Secondary | ICD-10-CM

## 2020-10-08 DIAGNOSIS — Z9109 Other allergy status, other than to drugs and biological substances: Secondary | ICD-10-CM | POA: Diagnosis not present

## 2020-10-08 LAB — POC COVID19 BINAXNOW: SARS Coronavirus 2 Ag: NEGATIVE

## 2020-10-08 MED ORDER — AMOXICILLIN-POT CLAVULANATE 875-125 MG PO TABS
1.0000 | ORAL_TABLET | Freq: Two times a day (BID) | ORAL | 0 refills | Status: DC
Start: 2020-10-08 — End: 2020-10-14

## 2020-10-08 NOTE — Patient Instructions (Signed)

## 2020-10-08 NOTE — Progress Notes (Signed)
I,Tianna Badgett,acting as a Education administrator for [employer], NP.,have documented all relevant documentation on the behalf of [employer], NP,as directed by  Bary Castilla, NP while in the presence of Bary Castilla, NP.  This visit occurred during the SARS-CoV-2 public health emergency.  Safety protocols were in place, including screening questions prior to the visit, additional usage of staff PPE, and extensive cleaning of exam room while observing appropriate contact time as indicated for disinfecting solutions.  Subjective:     Patient ID: Kathy Merritt , female    DOB: 1970/11/29 , 50 y.o.   MRN: [id number]   Chief Complaint  Patient presents with   Nasal Congestion    HPI  Patient is here for an outside visit for headache and congestion. She states that her headache is mostly at work. She tested negative yesterday for covid. She denies SOB, fever. She does feel fatigue at times. Will do a rapid test for covid for her. She does feel like she has allergies especially at work.     Past Medical History:  Diagnosis Date   Pre-diabetes    Vitamin D deficiency      Family History  Problem Relation Age of Onset   Healthy Mother    Diabetes Father    Hypertension Father    Cancer Father        liver and gallbladder   Dementia Maternal Grandmother      Current Outpatient Medications:    amoxicillin-clavulanate (AUGMENTIN) 875-125 MG tablet, Take 1 tablet by mouth 2 (two) times daily for 7 days., Disp: 14 tablet, Rfl: 0   ascorbic acid (VITAMIN C) 250 MG CHEW, Chew 250 mg by mouth daily., Disp: , Rfl:    b complex vitamins capsule, Take 1 capsule by mouth daily., Disp: , Rfl:    cholecalciferol (VITAMIN D) 25 MCG (1000 UNIT) tablet, Take 1,000 Units by mouth daily. Takes 2-3 gummies every other day, Disp: , Rfl:    Cyanocobalamin (VITAMIN B-12) 500 MCG SUBL, Place under the tongue., Disp: , Rfl:    magnesium 30 MG tablet, Take 30 mg by mouth 2 (two) times daily.,  Disp: , Rfl:    Multiple Vitamins-Minerals (MULTIVITAMIN WITH MINERALS) tablet, Take 1 tablet by mouth daily., Disp: , Rfl:    Allergies  Allergen Reactions   Pine Shortness Of Breath    wheezing   Latex Itching and Rash     Review of Systems  Constitutional:  Positive for fatigue. Negative for chills and fever.  HENT:  Positive for congestion and sinus pain. Negative for rhinorrhea.   Respiratory:  Negative for cough, shortness of breath and wheezing.   Cardiovascular:  Negative for chest pain and palpitations.  Endocrine: Negative for polydipsia, polyphagia and polyuria.  Musculoskeletal:  Negative for arthralgias and myalgias.  Neurological:  Positive for headaches.    There were no vitals filed for this visit. There is no height or weight on file to calculate BMI.   Objective:  Physical Exam Constitutional:      Appearance: Normal appearance.  HENT:     Head: Normocephalic and atraumatic.     Mouth/Throat:     Mouth: Mucous membranes are dry.  Cardiovascular:     Rate and Rhythm: Normal rate and regular rhythm.     Pulses: Normal pulses.     Heart sounds: Normal heart sounds. No murmur heard. Pulmonary:     Effort: Pulmonary effort is normal. No respiratory distress.     Breath sounds: Normal breath sounds.  No wheezing.  Skin:    General: Skin is warm and dry.     Capillary Refill: Capillary refill takes less than 2 seconds.  Neurological:     Mental Status: She is alert.        Assessment And Plan:     1. Sinus congestion - POC COVID-19- neg  - amoxicillin-clavulanate (AUGMENTIN) 875-125 MG tablet; Take 1 tablet by mouth 2 (two) times daily for 7 days.  Dispense: 14 tablet; Refill: 0  2. Environmental allergies  -OTC xyzal or Flonase to help with allergy symptoms  -Avoid triggers  The patient was encouraged to call or send a message through [place] for any questions or concerns.   Follow up: if symptoms persist or do not get better.   Side effects and  appropriate use of all the medication(s) were discussed with the patient today. Patient advised to use the medication(s) as directed by their healthcare provider. The patient was encouraged to read, review, and understand all associated package inserts and contact our office with any questions or concerns. The patient accepts the risks of the treatment plan and had an opportunity to ask questions.   Patient was given opportunity to ask questions. Patient verbalized understanding of the plan and was able to repeat key elements of the plan. All questions were answered to their satisfaction.  Raman Annica Marinello, DNP   I, Raman Koi Zangara have reviewed all documentation for this visit. The documentation on 10/08/20 for the exam, diagnosis, procedures, and orders are all accurate and complete.    IF YOU HAVE BEEN REFERRED TO A SPECIALIST, IT MAY TAKE 1-2 WEEKS TO SCHEDULE/PROCESS THE REFERRAL. IF YOU HAVE NOT HEARD FROM US/SPECIALIST IN TWO WEEKS, PLEASE GIVE [place] A CALL AT [phone].   THE PATIENT IS ENCOURAGED TO PRACTICE SOCIAL DISTANCING DUE TO THE COVID-19 PANDEMIC.

## 2020-10-09 DIAGNOSIS — M9904 Segmental and somatic dysfunction of sacral region: Secondary | ICD-10-CM | POA: Diagnosis not present

## 2020-10-09 DIAGNOSIS — M9905 Segmental and somatic dysfunction of pelvic region: Secondary | ICD-10-CM | POA: Diagnosis not present

## 2020-10-09 DIAGNOSIS — M9901 Segmental and somatic dysfunction of cervical region: Secondary | ICD-10-CM | POA: Diagnosis not present

## 2020-10-09 DIAGNOSIS — M9903 Segmental and somatic dysfunction of lumbar region: Secondary | ICD-10-CM | POA: Diagnosis not present

## 2020-10-12 LAB — SARS-COV-2 RNA (COVID-19) RESP VIRAL PNL QL NAAT

## 2020-10-13 DIAGNOSIS — M9903 Segmental and somatic dysfunction of lumbar region: Secondary | ICD-10-CM | POA: Diagnosis not present

## 2020-10-13 DIAGNOSIS — M9901 Segmental and somatic dysfunction of cervical region: Secondary | ICD-10-CM | POA: Diagnosis not present

## 2020-10-13 DIAGNOSIS — M9905 Segmental and somatic dysfunction of pelvic region: Secondary | ICD-10-CM | POA: Diagnosis not present

## 2020-10-13 DIAGNOSIS — M9904 Segmental and somatic dysfunction of sacral region: Secondary | ICD-10-CM | POA: Diagnosis not present

## 2020-10-14 ENCOUNTER — Other Ambulatory Visit: Payer: Self-pay

## 2020-10-14 ENCOUNTER — Ambulatory Visit (AMBULATORY_SURGERY_CENTER): Payer: 59 | Admitting: *Deleted

## 2020-10-14 VITALS — Ht 64.0 in | Wt 173.0 lb

## 2020-10-14 DIAGNOSIS — Z1211 Encounter for screening for malignant neoplasm of colon: Secondary | ICD-10-CM

## 2020-10-14 MED ORDER — NA SULFATE-K SULFATE-MG SULF 17.5-3.13-1.6 GM/177ML PO SOLN: 1.0000 | ORAL | 0 refills | Status: DC

## 2020-10-14 NOTE — Progress Notes (Signed)
Patient's pre-visit was done today over the phone with the patient due to COVID-19 pandemic. Name,DOB and address verified. Insurance verified. Patient denies any allergies to Eggs and Soy. Patient denies any problems with anesthesia/sedation. Patient denies taking diet pills or blood thinners. No home Oxygen. Packet of Prep instructions mailed to patient including a copy of a consent form-pt is aware. Patient understands to call us back with any questions or concerns. Patient is aware of our care-partner policy and 0000000 safety protocol.   EMMI education assigned to the patient for the procedure, sent to [place].   The patient is COVID-19 vaccinated.

## 2020-10-15 DIAGNOSIS — M9905 Segmental and somatic dysfunction of pelvic region: Secondary | ICD-10-CM | POA: Diagnosis not present

## 2020-10-15 DIAGNOSIS — M9901 Segmental and somatic dysfunction of cervical region: Secondary | ICD-10-CM | POA: Diagnosis not present

## 2020-10-15 DIAGNOSIS — M9904 Segmental and somatic dysfunction of sacral region: Secondary | ICD-10-CM | POA: Diagnosis not present

## 2020-10-15 DIAGNOSIS — M9903 Segmental and somatic dysfunction of lumbar region: Secondary | ICD-10-CM | POA: Diagnosis not present

## 2020-10-20 DIAGNOSIS — M9904 Segmental and somatic dysfunction of sacral region: Secondary | ICD-10-CM | POA: Diagnosis not present

## 2020-10-20 DIAGNOSIS — M9905 Segmental and somatic dysfunction of pelvic region: Secondary | ICD-10-CM | POA: Diagnosis not present

## 2020-10-20 DIAGNOSIS — M9901 Segmental and somatic dysfunction of cervical region: Secondary | ICD-10-CM | POA: Diagnosis not present

## 2020-10-20 DIAGNOSIS — M9903 Segmental and somatic dysfunction of lumbar region: Secondary | ICD-10-CM | POA: Diagnosis not present

## 2020-10-22 DIAGNOSIS — M9903 Segmental and somatic dysfunction of lumbar region: Secondary | ICD-10-CM | POA: Diagnosis not present

## 2020-10-22 DIAGNOSIS — M9901 Segmental and somatic dysfunction of cervical region: Secondary | ICD-10-CM | POA: Diagnosis not present

## 2020-10-22 DIAGNOSIS — M9905 Segmental and somatic dysfunction of pelvic region: Secondary | ICD-10-CM | POA: Diagnosis not present

## 2020-10-22 DIAGNOSIS — M9904 Segmental and somatic dysfunction of sacral region: Secondary | ICD-10-CM | POA: Diagnosis not present

## 2020-10-27 DIAGNOSIS — M9901 Segmental and somatic dysfunction of cervical region: Secondary | ICD-10-CM | POA: Diagnosis not present

## 2020-10-27 DIAGNOSIS — M9903 Segmental and somatic dysfunction of lumbar region: Secondary | ICD-10-CM | POA: Diagnosis not present

## 2020-10-27 DIAGNOSIS — M9904 Segmental and somatic dysfunction of sacral region: Secondary | ICD-10-CM | POA: Diagnosis not present

## 2020-10-27 DIAGNOSIS — M9905 Segmental and somatic dysfunction of pelvic region: Secondary | ICD-10-CM | POA: Diagnosis not present

## 2020-10-28 ENCOUNTER — Encounter: Payer: Self-pay | Admitting: Gastroenterology

## 2020-10-29 DIAGNOSIS — M9904 Segmental and somatic dysfunction of sacral region: Secondary | ICD-10-CM | POA: Diagnosis not present

## 2020-10-29 DIAGNOSIS — M9903 Segmental and somatic dysfunction of lumbar region: Secondary | ICD-10-CM | POA: Diagnosis not present

## 2020-10-29 DIAGNOSIS — M9905 Segmental and somatic dysfunction of pelvic region: Secondary | ICD-10-CM | POA: Diagnosis not present

## 2020-10-29 DIAGNOSIS — M9901 Segmental and somatic dysfunction of cervical region: Secondary | ICD-10-CM | POA: Diagnosis not present

## 2020-10-30 ENCOUNTER — Ambulatory Visit (AMBULATORY_SURGERY_CENTER): Payer: 59 | Admitting: Gastroenterology

## 2020-10-30 ENCOUNTER — Encounter: Payer: Self-pay | Admitting: Gastroenterology

## 2020-10-30 ENCOUNTER — Other Ambulatory Visit: Payer: Self-pay

## 2020-10-30 ENCOUNTER — Telehealth: Payer: Self-pay | Admitting: Gastroenterology

## 2020-10-30 VITALS — BP 138/82 | HR 64 | Temp 98.7°F | Resp 13 | Ht 64.0 in | Wt 173.0 lb

## 2020-10-30 DIAGNOSIS — D125 Benign neoplasm of sigmoid colon: Secondary | ICD-10-CM | POA: Diagnosis not present

## 2020-10-30 DIAGNOSIS — R7303 Prediabetes: Secondary | ICD-10-CM | POA: Diagnosis not present

## 2020-10-30 DIAGNOSIS — K635 Polyp of colon: Secondary | ICD-10-CM | POA: Diagnosis not present

## 2020-10-30 DIAGNOSIS — K6389 Other specified diseases of intestine: Secondary | ICD-10-CM | POA: Diagnosis not present

## 2020-10-30 DIAGNOSIS — Z1211 Encounter for screening for malignant neoplasm of colon: Secondary | ICD-10-CM

## 2020-10-30 MED ORDER — SODIUM CHLORIDE 0.9 % IV SOLN: 500.0000 mL | Freq: Once | INTRAVENOUS | Status: DC

## 2020-10-30 NOTE — Telephone Encounter (Signed)
Inbound call from patient have questions about procedure today.   She have a nose ring that she states she can not take out, wants to be sure that it would be okay for procedure.  States she have taken the 1st bowel prep but does not feel anything came out and wants to be sure before the second one.   Best contact number [phone]

## 2020-10-30 NOTE — Op Note (Signed)
Kathy Merritt Patient Name: Kathy Merritt Procedure Date: 10/30/2020 3:24 PM MRN: [id number] Endoscopist: Justice Britain , MD Age: 50 Referring MD:  Date of Birth: 1970-09-13 Gender: Female Account #: [id number] Procedure:                Colonoscopy Indications:              Screening for colorectal malignant neoplasm, This                            is the patient's first colonoscopy Medicines:                Monitored Anesthesia Care Procedure:                Pre-Anesthesia Assessment:                           - Prior to the procedure, a History and Physical                            was performed, and patient medications and                            allergies were reviewed. The patient's tolerance of                            previous anesthesia was also reviewed. The risks                            and benefits of the procedure and the sedation                            options and risks were discussed with the patient.                            All questions were answered, and informed consent                            was obtained. Prior Anticoagulants: The patient has                            taken no previous anticoagulant or antiplatelet                            agents. ASA Grade Assessment: II - A patient with                            mild systemic disease. After reviewing the risks                            and benefits, the patient was deemed in                            satisfactory condition to undergo the procedure.  After obtaining informed consent, the colonoscope                            was passed under direct vision. Throughout the                            procedure, the patient's blood pressure, pulse, and                            oxygen saturations were monitored continuously. The                            CF [id number] #[id number] was introduced through the anus                            and advanced  to the the cecum, identified by                            appendiceal orifice and ileocecal valve. The                            colonoscopy was performed without difficulty. The                            patient tolerated the procedure. The quality of the                            bowel preparation was good. The ileocecal valve,                            appendiceal orifice, and rectum were photographed. Scope In: 3:38:27 PM Scope Out: 3:54:28 PM Scope Withdrawal Time: 0 hours 9 minutes 38 seconds  Total Procedure Duration: 0 hours 16 minutes 1 second  Findings:                 The digital rectal exam findings include                            hemorrhoids. Pertinent negatives include no                            palpable rectal lesions.                           The colon (entire examined portion) was                            significantly tortuous.                           A 6 mm polyp was found in the sigmoid colon. The                            polyp was sessile. The polyp was removed with a  cold snare. Resection and retrieval were complete.                           Normal mucosa was found in the entire colon                            otherwise.                           Non-bleeding non-thrombosed internal hemorrhoids                            were found during retroflexion, during perianal                            exam and during digital exam. The hemorrhoids were                            Grade II (internal hemorrhoids that prolapse but                            reduce spontaneously). Complications:            No immediate complications. Estimated Blood Loss:     Estimated blood loss was minimal. Impression:               - Hemorrhoids found on digital rectal exam.                           - Tortuous colon.                           - One 6 mm polyp in the sigmoid colon, removed with                            a cold snare. Resected  and retrieved.                           - Normal mucosa in the entire examined colon                            otherwise.                           - Non-bleeding non-thrombosed internal hemorrhoids. Recommendation:           - The patient will be observed post-procedure,                            until all discharge criteria are met.                           - Discharge patient to home.                           - Patient has a contact number available for  emergencies. The signs and symptoms of potential                            delayed complications were discussed with the                            patient. Return to normal activities tomorrow.                            Written discharge instructions were provided to the                            patient.                           - High fiber diet.                           - Use FiberCon 1-2 tablets PO daily.                           - Continue present medications.                           - Await pathology results.                           - Repeat colonoscopy in 07/18/08 years for                            surveillance based on pathology results and                            findings of adenomatous tissue.                           - The findings and recommendations were discussed                            with the patient.                           - The findings and recommendations were discussed                            with the designated responsible adult. Justice Britain, MD 10/30/2020 3:59:28 PM

## 2020-10-30 NOTE — Progress Notes (Signed)
GASTROENTEROLOGY PROCEDURE H&P NOTE   Primary Care Physician: Minette Brine, FNP  HPI: Kathy Merritt is a 50 y.o. female who presents for Colonoscopy for screening.  Incidentally has normocytic anemia without ID.  Past Medical History:  Diagnosis Date   Anemia    Blood transfusion without reported diagnosis 2001   Pre-diabetes    Vitamin D deficiency    Past Surgical History:  Procedure Laterality Date   CESAREAN SECTION  1995, 2001   x 2   TOOTH EXTRACTION     WISDOM TOOTH EXTRACTION     Current Outpatient Medications  Medication Sig Dispense Refill   cholecalciferol (VITAMIN D) 25 MCG (1000 UNIT) tablet Take 1,000 Units by mouth daily. Takes 2-3 gummies every other day     magnesium 30 MG tablet Take 30 mg by mouth 2 (two) times daily.     Multiple Vitamins-Minerals (MULTIVITAMIN WITH MINERALS) tablet Take 1 tablet by mouth daily. LIQUID     Specialty Vitamins Products (COLLAGEN ULTRA PO) Take by mouth. With Biotin     VITAMIN E PO Take by mouth.     ascorbic acid (VITAMIN C) 250 MG CHEW Chew 250 mg by mouth daily. (Patient not taking: No sig reported)     Current Facility-Administered Medications  Medication Dose Route Frequency Provider Last Rate Last Admin   0.9 %  sodium chloride infusion  500 mL Intravenous Once Mansouraty, Telford Nab., MD        Current Outpatient Medications:    cholecalciferol (VITAMIN D) 25 MCG (1000 UNIT) tablet, Take 1,000 Units by mouth daily. Takes 2-3 gummies every other day, Disp: , Rfl:    magnesium 30 MG tablet, Take 30 mg by mouth 2 (two) times daily., Disp: , Rfl:    Multiple Vitamins-Minerals (MULTIVITAMIN WITH MINERALS) tablet, Take 1 tablet by mouth daily. LIQUID, Disp: , Rfl:    Specialty Vitamins Products (COLLAGEN ULTRA PO), Take by mouth. With Biotin, Disp: , Rfl:    VITAMIN E PO, Take by mouth., Disp: , Rfl:    ascorbic acid (VITAMIN C) 250 MG CHEW, Chew 250 mg by mouth daily. (Patient not taking: No sig reported),  Disp: , Rfl:   Current Facility-Administered Medications:    0.9 %  sodium chloride infusion, 500 mL, Intravenous, Once, Mansouraty, Telford Nab., MD Allergies  Allergen Reactions   Pine Shortness Of Breath    wheezing   Latex Itching and Rash   Family History  Problem Relation Age of Onset   Healthy Mother    Diabetes Father    Hypertension Father    Cancer Father        liver and gallbladder   Dementia Maternal Grandmother    Colon cancer Neg Hx    Colon polyps Neg Hx    Esophageal cancer Neg Hx    Rectal cancer Neg Hx    Stomach cancer Neg Hx    Social History   Socioeconomic History   Marital status: Divorced    Spouse name: Not on file   Number of children: 2   Years of education: Not on file   Highest education level: Not on file  Occupational History   Occupation: Clinic front office rep  Tobacco Use   Smoking status: Never   Smokeless tobacco: Never  Vaping Use   Vaping Use: Never used  Substance and Sexual Activity   Alcohol use: Not Currently   Drug use: No   Sexual activity: Not on file  Other Topics Concern  Not on file  Social History Narrative   Not on file   Social Determinants of Health   Financial Resource Strain: Not on file  Food Insecurity: Not on file  Transportation Needs: Not on file  Physical Activity: Not on file  Stress: Not on file  Social Connections: Not on file  Intimate Partner Violence: Not on file    Physical Exam: Vital signs in last 24 hours: '@VSRANGES'$ @   GEN: NAD EYE: Sclerae anicteric ENT: MMM CV: Non-tachycardic GI: Soft, NT/ND NEURO:  Alert & Oriented x 3  Lab Results: No results for input(s): WBC, HGB, HCT, PLT in the last 72 hours. BMET No results for input(s): NA, K, CL, CO2, GLUCOSE, BUN, CREATININE, CALCIUM in the last 72 hours. LFT No results for input(s): PROT, ALBUMIN, AST, ALT, ALKPHOS, BILITOT, BILIDIR, IBILI in the last 72 hours. PT/INR No results for input(s): LABPROT, INR in the last 72  hours.   Impression / Plan: This is a 50 y.o.female who presents for Colonoscopy for screening.  Incidentally has normocytic anemia without ID.  The risks and benefits of endoscopic evaluation/treatment were discussed with the patient and/or family; these include but are not limited to the risk of perforation, infection, bleeding, missed lesions, lack of diagnosis, severe illness requiring hospitalization, as well as anesthesia and sedation related illnesses.  The patient's history has been reviewed, patient examined, no change in status, and deemed stable for procedure.  The patient and/or family is agreeable to proceed.    Justice Britain, MD [facility] Office # [id number]

## 2020-10-30 NOTE — Telephone Encounter (Signed)
Pt called regarding her scheduled colonoscopy today stating that the skin on her outer buttocks is irritated and raw from bowel movements after she started taking the prep.  Advised pt to apply either Vasoline or A & D Ointment to the irritated area.  Pt voiced understanding.

## 2020-10-30 NOTE — Progress Notes (Signed)
Called to room to assist during endoscopic procedure.  Patient ID and intended procedure confirmed with present staff. Received instructions for my participation in the procedure from the performing physician.  

## 2020-10-30 NOTE — Telephone Encounter (Signed)
Called patient back regarding nose ring. Told her this was ok to leave in. She was also concerned about the prep not working. She was having liquid brown results after the first half. Reassured her that the second prep would continue the bowel prep and if she isn"t having clear to yellow liquid results to call us back by 11:30. Pt is for 3pm appt with Dr. Vernie Murders.

## 2020-10-30 NOTE — Patient Instructions (Signed)
1 polyp removed and sent to pathology.  Hemorrhoids.  High fiber diet and daily FiberCon 1-2 tablets recommended.    Resume previous medications.  Await pathology for final recommendations.  Handouts on findings given to patient.     YOU HAD AN ENDOSCOPIC PROCEDURE TODAY AT [facility]:   Refer to the procedure report that was given to you for any specific questions about what was found during the examination.  If the procedure report does not answer your questions, please call your gastroenterologist to clarify.  If you requested that your care partner not be given the details of your procedure findings, then the procedure report has been included in a sealed envelope for you to review at your convenience later.  YOU SHOULD EXPECT: Some feelings of bloating in the abdomen. Passage of more gas than usual.  Walking can help get rid of the air that was put into your GI tract during the procedure and reduce the bloating. If you had a lower endoscopy (such as a colonoscopy or flexible sigmoidoscopy) you may notice spotting of blood in your stool or on the toilet paper. If you underwent a bowel prep for your procedure, you may not have a normal bowel movement for a few days.  Please Note:  You might notice some irritation and congestion in your nose or some drainage.  This is from the oxygen used during your procedure.  There is no need for concern and it should clear up in a day or so.  SYMPTOMS TO REPORT IMMEDIATELY:  Following lower endoscopy (colonoscopy or flexible sigmoidoscopy):  Excessive amounts of blood in the stool  Significant tenderness or worsening of abdominal pains  Swelling of the abdomen that is new, acute  Fever of 100F or higher   For urgent or emergent issues, a gastroenterologist can be reached at any hour by calling [phone]. Do not use MyChart messaging for urgent concerns.    DIET:  We do recommend a small meal at first, but then you may proceed to  your regular diet.  Drink plenty of fluids but you should avoid alcoholic beverages for 24 hours.  ACTIVITY:  You should plan to take it easy for the rest of today and you should NOT DRIVE or use heavy machinery until tomorrow (because of the sedation medicines used during the test).    FOLLOW UP: Our staff will call the number listed on your records 48-72 hours following your procedure to check on you and address any questions or concerns that you may have regarding the information given to you following your procedure. If we do not reach you, we will leave a message.  We will attempt to reach you two times.  During this call, we will ask if you have developed any symptoms of COVID 19. If you develop any symptoms (ie: fever, flu-like symptoms, shortness of breath, cough etc.) before then, please call [phone].  If you test positive for Covid 19 in the 2 weeks post procedure, please call and report this information to [place].    If any biopsies were taken you will be contacted by phone or by letter within the next 1-3 weeks.  Please call us at [phone] if you have not heard about the biopsies in 3 weeks.    SIGNATURES/CONFIDENTIALITY: You and/or your care partner have signed paperwork which will be entered into your electronic medical record.  These signatures attest to the fact that that the information above on your After Visit  Summary has been reviewed and is understood.  Full responsibility of the confidentiality of this discharge information lies with you and/or your care-partner.  

## 2020-10-30 NOTE — Telephone Encounter (Signed)
Agree with plan of action. Thanks. GM

## 2020-10-30 NOTE — Progress Notes (Signed)
Pt's states no medical or surgical changes since previsit or office visit.   Cw vitals and PT IV.

## 2020-10-30 NOTE — Progress Notes (Signed)
PT taken to PACU. Monitors in place. VSS. Report given to RN. 

## 2020-11-03 ENCOUNTER — Telehealth: Payer: Self-pay | Admitting: *Deleted

## 2020-11-03 ENCOUNTER — Telehealth: Payer: Self-pay

## 2020-11-03 DIAGNOSIS — M9903 Segmental and somatic dysfunction of lumbar region: Secondary | ICD-10-CM | POA: Diagnosis not present

## 2020-11-03 DIAGNOSIS — M9901 Segmental and somatic dysfunction of cervical region: Secondary | ICD-10-CM | POA: Diagnosis not present

## 2020-11-03 DIAGNOSIS — M9905 Segmental and somatic dysfunction of pelvic region: Secondary | ICD-10-CM | POA: Diagnosis not present

## 2020-11-03 DIAGNOSIS — M9904 Segmental and somatic dysfunction of sacral region: Secondary | ICD-10-CM | POA: Diagnosis not present

## 2020-11-03 NOTE — Telephone Encounter (Signed)
First post procedure follow up call, no answer 

## 2020-11-03 NOTE — Telephone Encounter (Signed)
No answer for post procedure call back. Left VM. 

## 2020-11-05 ENCOUNTER — Encounter: Payer: Self-pay | Admitting: Gastroenterology

## 2020-12-18 ENCOUNTER — Other Ambulatory Visit: Payer: 59

## 2020-12-18 ENCOUNTER — Ambulatory Visit: Payer: 59 | Admitting: Physician Assistant

## 2021-01-12 DIAGNOSIS — M9901 Segmental and somatic dysfunction of cervical region: Secondary | ICD-10-CM | POA: Diagnosis not present

## 2021-01-12 DIAGNOSIS — M9903 Segmental and somatic dysfunction of lumbar region: Secondary | ICD-10-CM | POA: Diagnosis not present

## 2021-01-12 DIAGNOSIS — M9904 Segmental and somatic dysfunction of sacral region: Secondary | ICD-10-CM | POA: Diagnosis not present

## 2021-01-12 DIAGNOSIS — M9905 Segmental and somatic dysfunction of pelvic region: Secondary | ICD-10-CM | POA: Diagnosis not present

## 2021-03-08 ENCOUNTER — Encounter: Payer: Self-pay | Admitting: Physician Assistant

## 2021-03-17 ENCOUNTER — Telehealth: Payer: Self-pay | Admitting: Physician Assistant

## 2021-03-17 NOTE — Telephone Encounter (Signed)
Called patient regarding missed October appointments, left a voicemail.

## 2021-04-01 ENCOUNTER — Ambulatory Visit
Admission: RE | Admit: 2021-04-01 | Discharge: 2021-04-01 | Disposition: A | Discharge status: Home / Self Care | Payer: 59 | Source: Ambulatory Visit | Attending: Nurse Practitioner | Admitting: Nurse Practitioner

## 2021-04-01 DIAGNOSIS — N63 Unspecified lump in unspecified breast: Secondary | ICD-10-CM

## 2021-04-01 DIAGNOSIS — R928 Other abnormal and inconclusive findings on diagnostic imaging of breast: Secondary | ICD-10-CM | POA: Diagnosis not present

## 2021-04-01 DIAGNOSIS — N6321 Unspecified lump in the left breast, upper outer quadrant: Secondary | ICD-10-CM | POA: Diagnosis not present

## 2021-04-10 ENCOUNTER — Encounter: Payer: Self-pay | Admitting: Nurse Practitioner

## 2021-04-24 DIAGNOSIS — Z03818 Encounter for observation for suspected exposure to other biological agents ruled out: Secondary | ICD-10-CM | POA: Diagnosis not present

## 2021-04-24 DIAGNOSIS — Z20822 Contact with and (suspected) exposure to covid-19: Secondary | ICD-10-CM | POA: Diagnosis not present

## 2021-08-11 DIAGNOSIS — H524 Presbyopia: Secondary | ICD-10-CM | POA: Diagnosis not present

## 2021-09-01 ENCOUNTER — Encounter: Payer: Self-pay | Admitting: Nurse Practitioner

## 2021-09-01 ENCOUNTER — Ambulatory Visit (INDEPENDENT_AMBULATORY_CARE_PROVIDER_SITE_OTHER): Payer: 59 | Admitting: Nurse Practitioner

## 2021-09-01 VITALS — BP 120/62 | HR 79 | Temp 98.4°F | Ht 64.0 in | Wt 189.0 lb

## 2021-09-01 DIAGNOSIS — E6609 Other obesity due to excess calories: Secondary | ICD-10-CM | POA: Diagnosis not present

## 2021-09-01 DIAGNOSIS — Z23 Encounter for immunization: Secondary | ICD-10-CM | POA: Diagnosis not present

## 2021-09-01 DIAGNOSIS — Z0000 Encounter for general adult medical examination without abnormal findings: Secondary | ICD-10-CM

## 2021-09-01 DIAGNOSIS — Z6832 Body mass index (BMI) 32.0-32.9, adult: Secondary | ICD-10-CM

## 2021-09-01 DIAGNOSIS — Z862 Personal history of diseases of the blood and blood-forming organs and certain disorders involving the immune mechanism: Secondary | ICD-10-CM | POA: Diagnosis not present

## 2021-09-01 DIAGNOSIS — R7309 Other abnormal glucose: Secondary | ICD-10-CM | POA: Diagnosis not present

## 2021-09-01 DIAGNOSIS — R5383 Other fatigue: Secondary | ICD-10-CM

## 2021-09-01 DIAGNOSIS — R946 Abnormal results of thyroid function studies: Secondary | ICD-10-CM

## 2021-09-01 NOTE — Progress Notes (Addendum)
This visit occurred during the SARS-CoV-2 public health emergency.  Safety protocols were in place, including screening questions prior to the visit, additional usage of staff PPE, and extensive cleaning of exam room while observing appropriate contact time as indicated for disinfecting solutions.  Subjective:     Patient ID: Kathy Merritt , female    DOB: 1971/01/14 , 51 y.o.   MRN: [id number]   Chief Complaint  Patient presents with   Annual Exam    HPI  Patient presents today for physical. She had a cycle 2017, June 02, 2018 regular flow with cramping. She has been to GYN as well, will get records.      Past Medical History:  Diagnosis Date   Anemia    Blood transfusion without reported diagnosis 2001   Pre-diabetes    Vitamin D deficiency      Family History  Problem Relation Age of Onset   Healthy Mother    Diabetes Father    Hypertension Father    Cancer Father        liver and gallbladder   Dementia Maternal Grandmother    Colon cancer Neg Hx    Colon polyps Neg Hx    Esophageal cancer Neg Hx    Rectal cancer Neg Hx    Stomach cancer Neg Hx      Current Outpatient Medications:    ascorbic acid (VITAMIN C) 250 MG CHEW, Chew 250 mg by mouth daily., Disp: , Rfl:    cholecalciferol (VITAMIN D) 25 MCG (1000 UNIT) tablet, Take 1,000 Units by mouth daily. Takes 2-3 gummies every other day, Disp: , Rfl:    magnesium 30 MG tablet, Take 30 mg by mouth 2 (two) times daily., Disp: , Rfl:    Multiple Vitamins-Minerals (MULTIVITAMIN WITH MINERALS) tablet, Take 1 tablet by mouth daily. LIQUID, Disp: , Rfl:    Specialty Vitamins Products (COLLAGEN ULTRA PO), Take by mouth. With Biotin, Disp: , Rfl:    VITAMIN E PO, Take by mouth., Disp: , Rfl:   Current Facility-Administered Medications:    0.9 %  sodium chloride infusion, 500 mL, Intravenous, Once, Mansouraty, Telford Nab., MD   Allergies  Allergen Reactions   Pine Shortness Of Breath    wheezing   Latex  Itching and Rash      The patient states she is post menopausal status.   Patient's last menstrual period was 07/18/2013.. Negative for Dysmenorrhea and Negative for Menorrhagia. Negative for: breast discharge, breast lump(s), breast pain and breast self exam. Associated symptoms include abnormal vaginal bleeding. Pertinent negatives include abnormal bleeding (hematology), anxiety, decreased libido, depression, difficulty falling sleep, dyspareunia, history of infertility, nocturia, sexual dysfunction, sleep disturbances, urinary incontinence, urinary urgency, vaginal discharge and vaginal itching. Diet regular; she does admit to eating more junk food.   The patient states her exercise level is none, realizes she needs to exercise. She has been under some good stress.    The patient's tobacco use is:  Social History   Tobacco Use  Smoking Status Never  Smokeless Tobacco Never   She has been exposed to passive smoke. The patient's alcohol use is:  Social History   Substance and Sexual Activity  Alcohol Use Not Currently   Additional information: Last pap reported as having last year  Review of Systems  Constitutional:  Positive for fatigue.  HENT: Negative.    Eyes: Negative.   Respiratory: Negative.    Cardiovascular: Negative.   Gastrointestinal: Negative.   Endocrine: Negative.   Genitourinary: Negative.  Musculoskeletal: Negative.   Skin: Negative.   Allergic/Immunologic: Negative.   Neurological: Negative.   Hematological: Negative.   Psychiatric/Behavioral: Negative.       Today's Vitals   09/01/21 0900  BP: 120/62  Pulse: 79  Temp: 98.4 F (36.9 C)  TempSrc: Oral  Weight: 189 lb (85.7 kg)  Height: $Remove'5\' 4"'CHnlBtK$  (1.626 m)   Body mass index is 32.44 kg/m.  Wt Readings from Last 3 Encounters:  09/01/21 189 lb (85.7 kg)  10/30/20 173 lb (78.5 kg)  10/14/20 173 lb (78.5 kg)    Objective:  Physical Exam Vitals reviewed.  Constitutional:      General: She is not  in acute distress.    Appearance: Normal appearance. She is well-developed. She is obese.  HENT:     Head: Normocephalic and atraumatic.     Right Ear: Hearing, tympanic membrane, ear canal and external ear normal. There is no impacted cerumen.     Left Ear: Hearing, tympanic membrane, ear canal and external ear normal. There is no impacted cerumen.     Nose: Nose normal.     Mouth/Throat:     Mouth: Mucous membranes are moist.  Eyes:     General: Lids are normal.     Extraocular Movements: Extraocular movements intact.     Conjunctiva/sclera: Conjunctivae normal.     Pupils: Pupils are equal, round, and reactive to light.     Funduscopic exam:    Right eye: No papilledema.        Left eye: No papilledema.  Neck:     Thyroid: No thyroid mass.     Vascular: No carotid bruit.  Cardiovascular:     Rate and Rhythm: Normal rate and regular rhythm.     Pulses: Normal pulses.     Heart sounds: Normal heart sounds. No murmur heard. Pulmonary:     Effort: Pulmonary effort is normal. No respiratory distress.     Breath sounds: Normal breath sounds. No wheezing.  Chest:     Chest wall: No mass.  Breasts:    Tanner Score is 5.     Right: Normal. No mass or tenderness.     Left: Normal. No mass or tenderness.  Abdominal:     General: Abdomen is flat. Bowel sounds are normal. There is no distension.     Palpations: Abdomen is soft.     Tenderness: There is no abdominal tenderness.  Genitourinary:    Comments: Seen by GYN  Musculoskeletal:        General: No swelling. Normal range of motion.     Cervical back: Full passive range of motion without pain, normal range of motion and neck supple.     Right lower leg: No edema.     Left lower leg: No edema.  Lymphadenopathy:     Upper Body:     Right upper body: No supraclavicular, axillary or pectoral adenopathy.     Left upper body: No supraclavicular, axillary or pectoral adenopathy.  Skin:    General: Skin is warm and dry.      Capillary Refill: Capillary refill takes less than 2 seconds.     Comments: Nail splitting  Neurological:     General: No focal deficit present.     Mental Status: She is alert and oriented to person, place, and time.     Cranial Nerves: No cranial nerve deficit.     Sensory: No sensory deficit.     Motor: No weakness.  Psychiatric:  Mood and Affect: Mood normal.        Behavior: Behavior normal.        Thought Content: Thought content normal.        Judgment: Judgment normal.         Assessment And Plan:     1. Encounter for general adult medical examination w/o abnormal findings Behavior modifications discussed and diet history reviewed.   Pt will continue to exercise regularly and modify diet with low GI, plant based foods and decrease intake of processed foods.  Recommend intake of daily multivitamin, Vitamin D, and calcium.  Recommend mammogram and colonoscopy for preventive screenings, as well as recommend immunizations that include influenza, TDAP (I am unable to locate the copy she uploaded to Mychart), and Shingles (2nd vaccine given today) - Lipid panel  2. Class 1 obesity due to excess calories without serious comorbidity with body mass index (BMI) of 32.0 to 32.9 in adult  3. Encounter for immunization - Varicella-zoster vaccine IM  4. Abnormal thyroid function test Comments: She has had low TSH and seen Endo, repeat was improved she has not seen them since. will check thyroid levels today and send copy to Endo - CMP14+EGFR - T4 - T3, free - TSH  5. Abnormal glucose Comments: Hgba1c is slightly elevated. Will check levels today.  - Hemoglobin A1c  6. Other fatigue Comments: She has been to Hematology and her iron levels are not significantly low and her TSH is low but thought to not be the cause. Would like to determine if cardiac  - Ambulatory referral to Cardiology  7. History of anemia - CBC     Patient was given opportunity to ask questions.  Patient verbalized understanding of the plan and was able to repeat key elements of the plan. All questions were answered to their satisfaction.   Minette Brine, FNP   I, Minette Brine, FNP, have reviewed all documentation for this visit. The documentation on 09/01/21 for the exam, diagnosis, procedures, and orders are all accurate and complete.   THE PATIENT IS ENCOURAGED TO PRACTICE SOCIAL DISTANCING DUE TO THE COVID-19 PANDEMIC.

## 2021-09-01 NOTE — Patient Instructions (Signed)

## 2021-09-02 LAB — LIPID PANEL
Chol/HDL Ratio: 3.3 ratio (ref 0.0–4.4)
Cholesterol, Total: 161 mg/dL (ref 100–199)
HDL: 49 mg/dL (ref >39)
LDL Chol Calc (NIH): 94 mg/dL (ref 0–99)
Triglycerides: 98 mg/dL (ref 0–149)
VLDL Cholesterol Cal: 18 mg/dL (ref 5–40)

## 2021-09-02 LAB — CMP14+EGFR
ALT: 18 IU/L (ref 0–32)
AST: 13 IU/L (ref 0–40)
Albumin/Globulin Ratio: 1.3 (ref 1.2–2.2)
Albumin: 4.1 g/dL (ref 3.8–4.8)
Alkaline Phosphatase: 71 IU/L (ref 44–121)
BUN/Creatinine Ratio: 16 (ref 9–23)
BUN: 11 mg/dL (ref 6–24)
Bilirubin Total: 0.3 mg/dL (ref 0.0–1.2)
CO2: 26 mmol/L (ref 20–29)
Calcium: 9.2 mg/dL (ref 8.7–10.2)
Chloride: 103 mmol/L (ref 96–106)
Creatinine, Ser: 0.67 mg/dL (ref 0.57–1.00)
Globulin, Total: 3.2 g/dL (ref 1.5–4.5)
Glucose: 97 mg/dL (ref 70–99)
Potassium: 5.2 mmol/L (ref 3.5–5.2)
Sodium: 139 mmol/L (ref 134–144)
Total Protein: 7.3 g/dL (ref 6.0–8.5)
eGFR: 106 mL/min/{1.73_m2} (ref >59)

## 2021-09-02 LAB — CBC
Hematocrit: 34.1 % (ref 34.0–46.6)
Hemoglobin: 11.1 g/dL (ref 11.1–15.9)
MCH: 27.1 pg (ref 26.6–33.0)
MCHC: 32.6 g/dL (ref 31.5–35.7)
MCV: 83 fL (ref 79–97)
Platelets: 364 10*3/uL (ref 150–450)
RBC: 4.09 x10E6/uL (ref 3.77–5.28)
RDW: 13.8 % (ref 11.7–15.4)
WBC: 4.9 10*3/uL (ref 3.4–10.8)

## 2021-09-02 LAB — HEMOGLOBIN A1C
Est. average glucose Bld gHb Est-mCnc: 134 mg/dL
Hgb A1c MFr Bld: 6.3 % — ABNORMAL HIGH (ref 4.8–5.6)

## 2021-09-02 LAB — T4: T4, Total: 6 ug/dL (ref 4.5–12.0)

## 2021-09-02 LAB — TSH: TSH: 0.667 u[IU]/mL (ref 0.450–4.500)

## 2021-09-02 LAB — T3, FREE: T3, Free: 2.7 pg/mL (ref 2.0–4.4)

## 2021-09-07 ENCOUNTER — Encounter: Payer: Self-pay | Admitting: Nurse Practitioner

## 2021-09-07 DIAGNOSIS — N951 Menopausal and female climacteric states: Secondary | ICD-10-CM | POA: Diagnosis not present

## 2021-09-20 NOTE — Progress Notes (Unsigned)
Cardiology Office Note:    Date:  09/21/2021   ID:  Kathy Merritt, DOB 1970/05/09, MRN [id number]  PCP:  Minette Brine, [place] [facility] Cardiologist:  Juanjesus Pepperman    Referring MD: Minette Brine, FNP   Chief Complaint  Patient presents with   Leg Swelling     History of Present Illness:    Kathy Merritt is a 51 y.o. female with a hx of anemia   Has irregl menstral cycles  Has not seem much improvement in her anemia   Has had her thyroid evaluated  Hb has been 11 for years  Iron levels overall look okay.  Her iron saturation level is 17 which is mildly low.  Her other iron levels including serum iron, TIBC, ferritin levels, U IBC are all normal.  No hx of sickle  cell or beta thal.  Wakes up fatigued, kidney just 30 seconds Can fall asleep very quickly  Does not snore to her knowledge sleep sleep study.    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  She did not have an upper endoscopy.  She does not report any significant upper abdominal pain. No reflux symptoms.  She is also seeing hematology.  Work up was unremarkable   Past Medical History:  Diagnosis Date   Anemia    Blood transfusion without reported diagnosis 2001   Elevated hemoglobin A1c    Fatigue    Pre-diabetes    Vitamin D deficiency     Past Surgical History:  Procedure Laterality Date   CESAREAN SECTION  1995, 2001   x 2   TOOTH EXTRACTION     WISDOM TOOTH EXTRACTION      Current Medications: Current Meds  Medication Sig   ascorbic acid (VITAMIN C) 250 MG CHEW Chew 250 mg by mouth daily.   cholecalciferol  (VITAMIN D) 25 MCG (1000 UNIT) tablet Take 1,000 Units by mouth daily. Takes 2-3 gummies every other day   magnesium 30 MG tablet Take 30 mg by mouth 2 (two) times daily.   Multiple Vitamins-Minerals (MULTIVITAMIN WITH MINERALS) tablet Take 1 tablet by mouth daily. LIQUID   Specialty Vitamins Products (COLLAGEN ULTRA PO) Take by mouth. With Biotin   VITAMIN E PO Take by mouth.   Current Facility-Administered Medications for the 09/21/21 encounter (Office Visit) with Trustin Chapa, Wonda Cheng, MD  Medication   0.9 %  sodium chloride infusion     Allergies:   Pine and Latex   Social History   Socioeconomic History   Marital status: Divorced    Spouse name: Not on file   Number of children: 2   Years of education: Not on file   Highest education level: Not on file  Occupational History   Occupation: Clinic front office rep  Tobacco Use   Smoking status: Never   Smokeless tobacco: Never  Vaping Use   Vaping Use: Never used  Substance and Sexual Activity   Alcohol use: Not Currently   Drug use: No   Sexual activity: Not on file  Other Topics Concern   Not on file  Social History  Narrative   Not on file   Social Determinants of Health   Financial Resource Strain: Not on file  Food Insecurity: Not on file  Transportation Needs: Not on file  Physical Activity: Not on file  Stress: Not on file  Social Connections: Not on file     Family History: The patient's family history includes Cancer in her father; Dementia in her maternal grandmother; Diabetes in her father; Healthy in her mother; Hypertension in her father. There is no history of Colon cancer, Colon polyps, Esophageal cancer, Rectal cancer, or Stomach cancer.  ROS:   Please see the history of present illness.     All other systems reviewed and are negative.  EKGs/Labs/Other Studies Reviewed:    The following studies were reviewed today:   EKG: September 21, 2021: Normal sinus rhythm at 72.  Right bundle branch block.   Abnormal EKG.  Recent Labs: 09/01/2021: ALT 18; BUN 11; Creatinine, Ser 0.67; Hemoglobin 11.1; Platelets 364; Potassium 5.2; Sodium 139; TSH 0.667  Recent Lipid Panel    Component Value Date/Time   CHOL 161 09/01/2021 0954   TRIG 98 09/01/2021 0954   HDL 49 09/01/2021 0954   CHOLHDL 3.3 09/01/2021 0954   LDLCALC 94 09/01/2021 0954     Risk Assessment/Calculations:      STOP-Bang Score:  2       Physical Exam:    VS:  BP 128/80   Pulse 72   Ht '5\' 4"'$  (1.626 m)   Wt 189 lb 6.4 oz (85.9 kg)   LMP 07/18/2013   SpO2 99%   BMI 32.51 kg/m     Wt Readings from Last 3 Encounters:  09/21/21 189 lb 6.4 oz (85.9 kg)  09/01/21 189 lb (85.7 kg)  10/30/20 173 lb (78.5 kg)     GEN:  Well nourished, well developed in no acute distress HEENT: Normal NECK: No JVD; No carotid bruits LYMPHATICS: No lymphadenopathy CARDIAC: Regular rate S1-S2.  Soft systolic murmur. RESPIRATORY:  Clear to auscultation without rales, wheezing or rhonchi  ABDOMEN: Soft, non-tender, non-distended MUSCULOSKELETAL:  No edema; No deformity  SKIN: Warm and dry NEUROLOGIC:  Alert and oriented x 3 PSYCHIATRIC:  Normal affect   ASSESSMENT:    1. Fatigue, unspecified type   2. Murmur, cardiac    PLAN:    In order of problems listed above:  Fatigue: She has lots of daytime fatigue.  She is completely exhausted and falls asleep very quickly.  She does not start to her knowledge but I am suspicious that she may have some sleep apnea. We will screen her for sleep apnea.  Perhaps she can have a home sleep study.  We will get an echocardiogram to evaluate her LV function and valvular function.  She is also been chronically anemic.  It is possible that her anemia may be playing a role in this.  She has been to see the hematologist and there was no mention of any red blood cell abnormalities such as bed of thalassemia or sickle cell anemia.   2.  Anemia: She has had a work-up for her anemia.  She seen  hematology and gastroenterology.  No etiology was found. She still has some unusual stools on occasion.  She may need to be heme tested.  3.  Cardiac murmur: She has a soft systolic outflow murmur.  This may be due to her anemia but also may be due to trivial mitral or tricuspid regurgitation.  We will be getting an echocardiogram.  I have recommended that she exercise.  We have talked discussed reducing the carbohydrates in her diet.  I will see her in 3 months for follow-up visit.   Medication Adjustments/Labs and Tests Ordered: Current medicines are reviewed at length with the patient today.  Concerns regarding medicines are outlined above.  Orders Placed This Encounter  Procedures   EKG 12-Lead   ECHOCARDIOGRAM COMPLETE   Itamar Sleep Study   No orders of the defined types were placed in this encounter.   Patient Instructions  Medication Instructions:  Your physician recommends that you continue on your current medications as directed. Please refer to the Current Medication list given to you today.  *If you need a refill on your cardiac medications before your next appointment, please call your pharmacy*   Lab Work: NONE If you have labs (blood work) drawn today and your tests are completely normal, you will receive your results only by: [place] (if you have MyChart) OR A paper copy in the mail If you have any lab test that is abnormal or we need to change your treatment, we will call you to review the results.   Testing/Procedures: Itamar Sleep study Your physician has recommended that you have a sleep study. This test records several body functions during sleep, including: brain activity, eye movement, oxygen and carbon dioxide blood levels, heart rate and rhythm, breathing rate and rhythm, the flow of air through your mouth and nose, snoring, body muscle movements, and chest and belly movement.  ECHO Your physician has requested that you have an  echocardiogram. Echocardiography is a painless test that uses sound waves to create images of your heart. It provides your doctor with information about the size and shape of your heart and how well your heart's chambers and valves are working. This procedure takes approximately one hour. There are no restrictions for this procedure.  Follow-Up: At [facility], you and your health needs are our priority.  As part of our continuing mission to provide you with exceptional heart care, we have created designated Provider Care Teams.  These Care Teams include your primary Cardiologist (physician) and Advanced Practice Providers (APPs -  Physician Assistants and Nurse Practitioners) who all work together to provide you with the care you need, when you need it.  Your next appointment:   3 month(s)  The format for your next appointment:   In Person  Provider:   Mertie Moores, MD  Important Information About Sugar         Signed, Mertie Moores, MD  09/21/2021 5:37 PM    [place]

## 2021-09-21 ENCOUNTER — Encounter: Payer: Self-pay | Admitting: Cardiovascular Disease

## 2021-09-21 ENCOUNTER — Telehealth: Payer: Self-pay | Admitting: *Deleted

## 2021-09-21 ENCOUNTER — Ambulatory Visit: Payer: 59 | Admitting: Cardiovascular Disease

## 2021-09-21 VITALS — BP 128/80 | HR 72 | Ht 64.0 in | Wt 189.4 lb

## 2021-09-21 DIAGNOSIS — R011 Cardiac murmur, unspecified: Secondary | ICD-10-CM | POA: Diagnosis not present

## 2021-09-21 DIAGNOSIS — R5383 Other fatigue: Secondary | ICD-10-CM

## 2021-09-21 NOTE — Telephone Encounter (Signed)
SET UP DATE 09/21/21

## 2021-09-21 NOTE — Telephone Encounter (Signed)
Pt was seen in the office today by Dr. Acie Fredrickson, who ordered an Itamar study. Janan Halter, RN Clinic Supervisor set pt up with sleep study. Pt agreeable to signed waiver.

## 2021-09-21 NOTE — Telephone Encounter (Signed)
-----   Message from Donnalee Curry K sent at 09/21/2021  3:38 PM EDT ----- Regarding: itamar Can someone come to rm 2 in [address] and do an itamar?

## 2021-09-21 NOTE — Patient Instructions (Signed)
Medication Instructions:  Your physician recommends that you continue on your current medications as directed. Please refer to the Current Medication list given to you today.  *If you need a refill on your cardiac medications before your next appointment, please call your pharmacy*   Lab Work: NONE If you have labs (blood work) drawn today and your tests are completely normal, you will receive your results only by: [place] (if you have MyChart) OR A paper copy in the mail If you have any lab test that is abnormal or we need to change your treatment, we will call you to review the results.   Testing/Procedures: Itamar Sleep study Your physician has recommended that you have a sleep study. This test records several body functions during sleep, including: brain activity, eye movement, oxygen and carbon dioxide blood levels, heart rate and rhythm, breathing rate and rhythm, the flow of air through your mouth and nose, snoring, body muscle movements, and chest and belly movement.  ECHO Your physician has requested that you have an echocardiogram. Echocardiography is a painless test that uses sound waves to create images of your heart. It provides your doctor with information about the size and shape of your heart and how well your heart's chambers and valves are working. This procedure takes approximately one hour. There are no restrictions for this procedure.  Follow-Up: At [facility], you and your health needs are our priority.  As part of our continuing mission to provide you with exceptional heart care, we have created designated Provider Care Teams.  These Care Teams include your primary Cardiologist (physician) and Advanced Practice Providers (APPs -  Physician Assistants and Nurse Practitioners) who all work together to provide you with the care you need, when you need it.  Your next appointment:   3 month(s)  The format for your next appointment:   In Person  Provider:    Mertie Moores, MD  Important Information About Sugar

## 2021-09-27 DIAGNOSIS — F5101 Primary insomnia: Secondary | ICD-10-CM | POA: Diagnosis not present

## 2021-09-27 DIAGNOSIS — N951 Menopausal and female climacteric states: Secondary | ICD-10-CM | POA: Diagnosis not present

## 2021-09-27 DIAGNOSIS — R5382 Chronic fatigue, unspecified: Secondary | ICD-10-CM | POA: Diagnosis not present

## 2021-10-04 NOTE — Telephone Encounter (Signed)
Prior Authorization for [facility] sent to [facility] via web portal. No Auth Required: Case ID:  [id number]

## 2021-10-04 NOTE — Telephone Encounter (Signed)
Pt aware ok to proceed with sleep study PIN# given to pt [phone]. Pt thanked me for the help. Pt will do sleep study this week.   Called and made the patient aware that she may proceed with the [facility] Sleep Study. PIN # provided to the patient. Patient made aware that she will be contacted after the test has been read with the results and any recommendations. Patient verbalized understanding and thanked me for the call.

## 2021-10-08 ENCOUNTER — Ambulatory Visit (HOSPITAL_COMMUNITY): Payer: 59 | Attending: Internal Medicine

## 2021-10-08 DIAGNOSIS — R5383 Other fatigue: Secondary | ICD-10-CM | POA: Diagnosis not present

## 2021-10-08 DIAGNOSIS — R011 Cardiac murmur, unspecified: Secondary | ICD-10-CM | POA: Insufficient documentation

## 2021-10-08 LAB — ECHOCARDIOGRAM COMPLETE
AR max vel: 2.63 cm2
AV Area VTI: 2.27 cm2
AV Area mean vel: 2.23 cm2
AV Mean grad: 6.7 mmHg
AV Peak grad: 11.9 mmHg
Ao pk vel: 1.73 m/s
Area-P 1/2: 3.84 cm2
S' Lateral: 2.5 cm

## 2021-10-27 ENCOUNTER — Encounter: Payer: Self-pay | Admitting: Nurse Practitioner

## 2021-10-27 ENCOUNTER — Ambulatory Visit: Payer: 59 | Admitting: Nurse Practitioner

## 2021-10-27 VITALS — BP 120/70 | HR 69 | Temp 98.1°F | Ht 64.2 in | Wt 185.0 lb

## 2021-10-27 DIAGNOSIS — L299 Pruritus, unspecified: Secondary | ICD-10-CM

## 2021-10-27 DIAGNOSIS — R21 Rash and other nonspecific skin eruption: Secondary | ICD-10-CM | POA: Diagnosis not present

## 2021-10-27 NOTE — Progress Notes (Signed)
I,Tianna Badgett,acting as a Education administrator for [employer], FNP.,have documented all relevant documentation on the behalf of Minette Brine, FNP,as directed by  Minette Brine, FNP while in the presence of Minette Brine, [place].  Subjective:     Patient ID: Kathy Merritt , female    DOB: November 22, 1970 , 51 y.o.   MRN: [id number]   Chief Complaint  Patient presents with   Rash    HPI  Patient presents for treatment of rash. She had spent the night at her friends house and the next day had rash to left side of face. She has been going to [facility] replacement and had rash to her right side of her face. She has stopped taking the gel. She is no longer taking the gel. Her energy had started improved.   She has taken prednisone and did not do well. She reports it caused her constipation. She has taken benadryl.   Rash The affected locations include the face, left arm, right arm, left hand and right hand. She was exposed to nothing. Pertinent negatives include no facial edema or fatigue. Past treatments include topical steroids.     Past Medical History:  Diagnosis Date   Anemia    Blood transfusion without reported diagnosis 2001   Elevated hemoglobin A1c    Fatigue    Pre-diabetes    Vitamin D deficiency      Family History  Problem Relation Age of Onset   Healthy Mother    Diabetes Father    Hypertension Father    Cancer Father        liver and gallbladder   Dementia Maternal Grandmother    Colon cancer Neg Hx    Colon polyps Neg Hx    Esophageal cancer Neg Hx    Rectal cancer Neg Hx    Stomach cancer Neg Hx      Current Outpatient Medications:    NON FORMULARY, gymnema, Disp: , Rfl:    ascorbic acid (VITAMIN C) 250 MG CHEW, Chew 250 mg by mouth daily., Disp: , Rfl:    cholecalciferol (VITAMIN D) 25 MCG (1000 UNIT) tablet, Take 1,000 Units by mouth daily. Takes 2-3 gummies every other day, Disp: , Rfl:    magnesium 30 MG tablet, Take 30 mg by mouth 2 (two) times  daily., Disp: , Rfl:    Multiple Vitamins-Minerals (MULTIVITAMIN WITH MINERALS) tablet, Take 1 tablet by mouth daily. LIQUID, Disp: , Rfl:    Specialty Vitamins Products (COLLAGEN ULTRA PO), Take by mouth. With Biotin, Disp: , Rfl:    VITAMIN E PO, Take by mouth., Disp: , Rfl:   Current Facility-Administered Medications:    0.9 %  sodium chloride infusion, 500 mL, Intravenous, Once, Mansouraty, Telford Nab., MD   Allergies  Allergen Reactions   Pine Shortness Of Breath    wheezing   Prednisone    Latex Itching and Rash     Review of Systems  Constitutional: Negative.  Negative for fatigue.  Respiratory: Negative.    Cardiovascular: Negative.   Gastrointestinal: Negative.   Skin:  Positive for rash.  Neurological: Negative.   Psychiatric/Behavioral: Negative.       Today's Vitals   10/27/21 1226  BP: 120/70  Pulse: 69  Temp: 98.1 F (36.7 C)  TempSrc: Oral  Weight: 185 lb (83.9 kg)  Height: 5' 4.2" (1.631 m)   Body mass index is 31.56 kg/m.  Wt Readings from Last 3 Encounters:  10/27/21 185 lb (83.9 kg)  09/21/21 189 lb  6.4 oz (85.9 kg)  09/01/21 189 lb (85.7 kg)    Objective:  Physical Exam Vitals reviewed.  Constitutional:      Appearance: She is well-developed.  HENT:     Head: Normocephalic and atraumatic.  Eyes:     Pupils: Pupils are equal, round, and reactive to light.  Cardiovascular:     Rate and Rhythm: Normal rate and regular rhythm.     Pulses: Normal pulses.     Heart sounds: Normal heart sounds. No murmur heard. Pulmonary:     Effort: Pulmonary effort is normal.     Breath sounds: Normal breath sounds.  Musculoskeletal:        General: Normal range of motion.  Skin:    General: Skin is warm and dry.     Capillary Refill: Capillary refill takes less than 2 seconds.     Findings: Rash (scattered papular rash to bilateal arms, 3 papules on left side of face linear, with slight erythema) present.  Neurological:     General: No focal deficit  present.     Mental Status: She is alert and oriented to person, place, and time.     Cranial Nerves: No cranial nerve deficit.     Motor: No weakness.  Psychiatric:        Mood and Affect: Mood normal.        Behavior: Behavior normal.        Thought Content: Thought content normal.        Judgment: Judgment normal.         Assessment And Plan:     1. Rash Comments: She is to take claritin daily. Continue with hydrocortisone cream and benadryl cream. Possible insect bites, I do not feel like this is an allergic rx due to presentation  2. Pruritus Comments: Take antihistamine and use Benadryl cream with hydrocortisone cream     Patient was given opportunity to ask questions. Patient verbalized understanding of the plan and was able to repeat key elements of the plan. All questions were answered to their satisfaction.  Minette Brine, FNP   I, Minette Brine, FNP, have reviewed all documentation for this visit. The documentation on 10/27/21 for the exam, diagnosis, procedures, and orders are all accurate and complete.   IF YOU HAVE BEEN REFERRED TO A SPECIALIST, IT MAY TAKE 1-2 WEEKS TO SCHEDULE/PROCESS THE REFERRAL. IF YOU HAVE NOT HEARD FROM US/SPECIALIST IN TWO WEEKS, PLEASE GIVE [place] A CALL AT [phone].   THE PATIENT IS ENCOURAGED TO PRACTICE SOCIAL DISTANCING DUE TO THE COVID-19 PANDEMIC.

## 2021-10-27 NOTE — Patient Instructions (Signed)
Rash, Adult  A rash is a change in the color of your skin. A rash can also change the way your skin feels. There are many different conditions and factors that can cause a rash. Follow these instructions at home: The goal of treatment is to stop the itching and keep the rash from spreading. Watch for any changes in your symptoms. Let your doctor know about them. Follow these instructions to help with your condition: Medicine Take or apply over-the-counter and prescription medicines only as told by your doctor. These may include medicines: To treat red or swollen skin (corticosteroid creams). To treat itching. To treat an allergy (oral antihistamines). To treat very bad symptoms (oral corticosteroids).  Skin care Put cool cloths (compresses) on the affected areas. Do not scratch or rub your skin. Avoid covering the rash. Make sure that the rash is exposed to air as much as possible. Managing itching and discomfort Avoid hot showers or baths. These can make itching worse. A cold shower may help. Try taking a bath with: Epsom salts. You can get these at your local pharmacy or grocery store. Follow the instructions on the package. Baking soda. Pour a small amount into the bath as told by your doctor. Colloidal oatmeal. You can get this at your local pharmacy or grocery store. Follow the instructions on the package. Try putting baking soda paste onto your skin. Stir water into baking soda until it gets like a paste. Try putting on a lotion that relieves itchiness (calamine lotion). Keep cool and out of the sun. Sweating and being hot can make itching worse. General instructions  Rest as needed. Drink enough fluid to keep your pee (urine) pale yellow. Wear loose-fitting clothing. Avoid scented soaps, detergents, and perfumes. Use gentle soaps, detergents, perfumes, and other cosmetic products. Avoid anything that causes your rash. Keep a journal to help track what causes your rash. Write  down: What you eat. What cosmetic products you use. What you drink. What you wear. This includes jewelry. Keep all follow-up visits as told by your doctor. This is important. Contact a doctor if: You sweat at night. You lose weight. You pee (urinate) more than normal. You pee less than normal, or you notice that your pee is a darker color than normal. You feel weak. You throw up (vomit). Your skin or the whites of your eyes look yellow (jaundice). Your skin: Tingles. Is numb. Your rash: Does not go away after a few days. Gets worse. You are: More thirsty than normal. More tired than normal. You have: New symptoms. Pain in your belly (abdomen). A fever. Watery poop (diarrhea). Get help right away if: You have a fever and your symptoms suddenly get worse. You start to feel mixed up (confused). You have a very bad headache or a stiff neck. You have very bad joint pains or stiffness. You have jerky movements that you cannot control (seizure). Your rash covers all or most of your body. The rash may or may not be painful. You have blisters that: Are on top of the rash. Grow larger. Grow together. Are painful. Are inside your nose or mouth. You have a rash that: Looks like purple pinprick-sized spots all over your body. Has a "bull's eye" or looks like a target. Is red and painful, causes your skin to peel, and is not from being in the sun too long. Summary A rash is a change in the color of your skin. A rash can also change the way your skin   feels. The goal of treatment is to stop the itching and keep the rash from spreading. Take or apply over-the-counter and prescription medicines only as told by your doctor. Contact a doctor if you have new symptoms or symptoms that get worse. Keep all follow-up visits as told by your doctor. This is important. This information is not intended to replace advice given to you by your health care provider. Make sure you discuss any  questions you have with your health care provider. Document Revised: 12/10/2020 Document Reviewed: 12/10/2020 Elsevier Patient Education  2023 Elsevier Inc.  

## 2021-11-18 IMAGING — MG DIGITAL SCREENING BILAT W/ TOMO W/ CAD
8 series · 8 of 24 positions shown · non-contrast
Comparison: None.

CLINICAL DATA: Screening. This is patient's first mammogram.

EXAM:
DIGITAL SCREENING BILATERAL MAMMOGRAM WITH TOMO AND CAD

[L CC synth-2D]
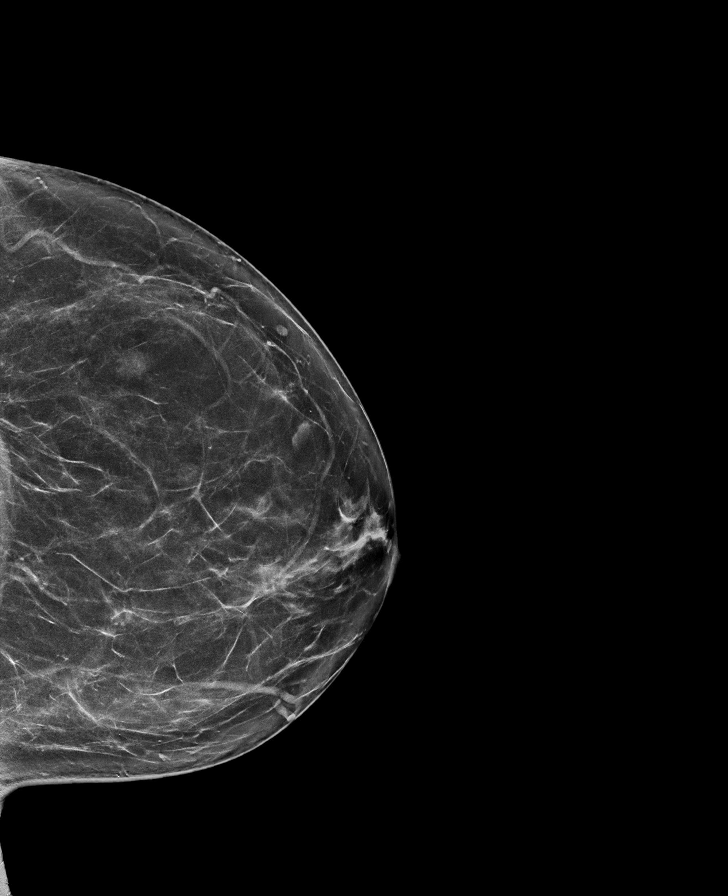

[R MLO synth-2D]
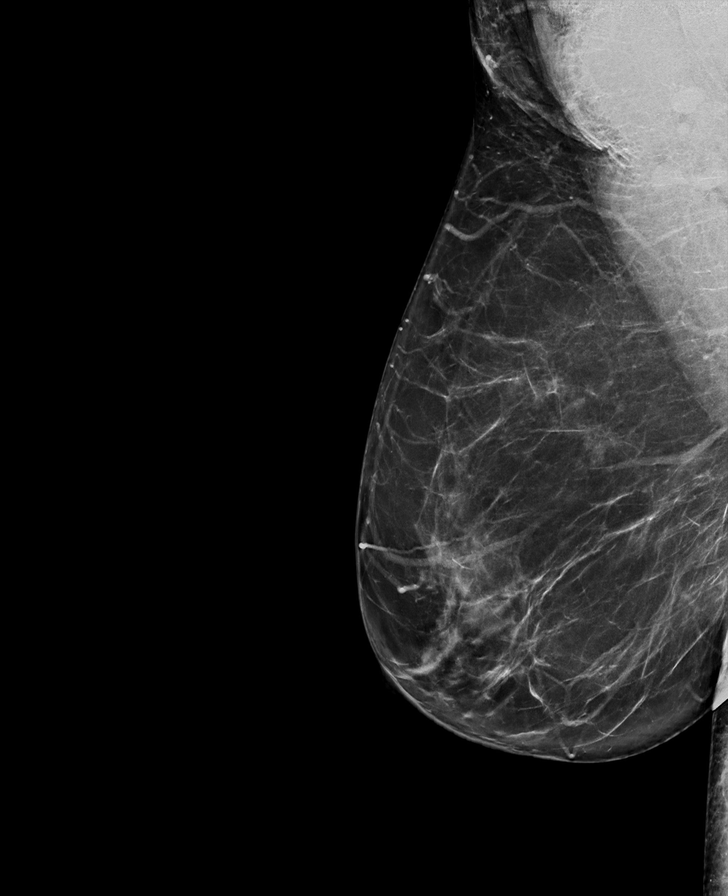

[L MLO synth-2D]
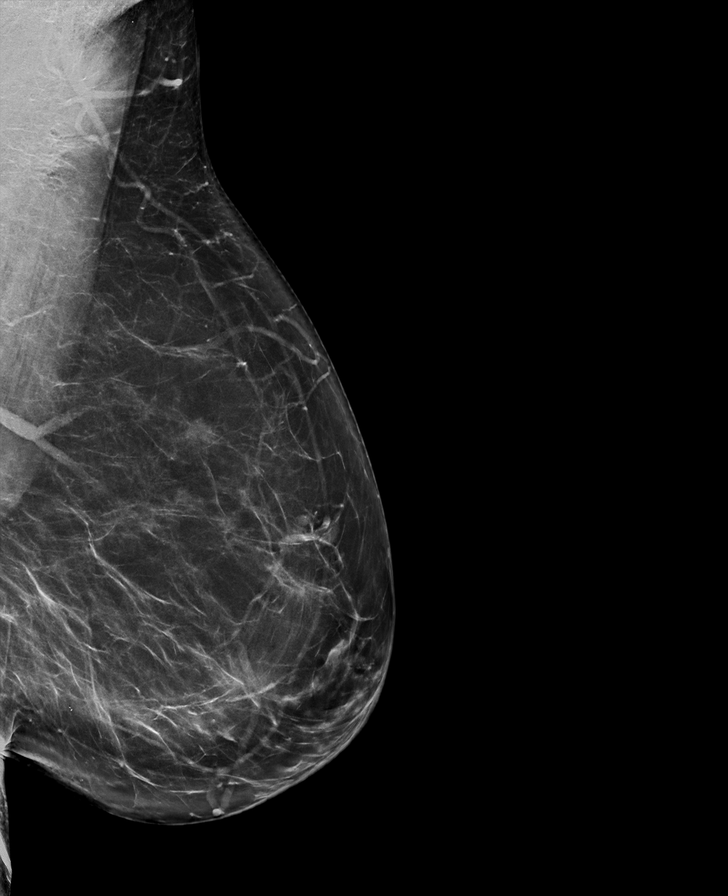

[R CC synth-2D]
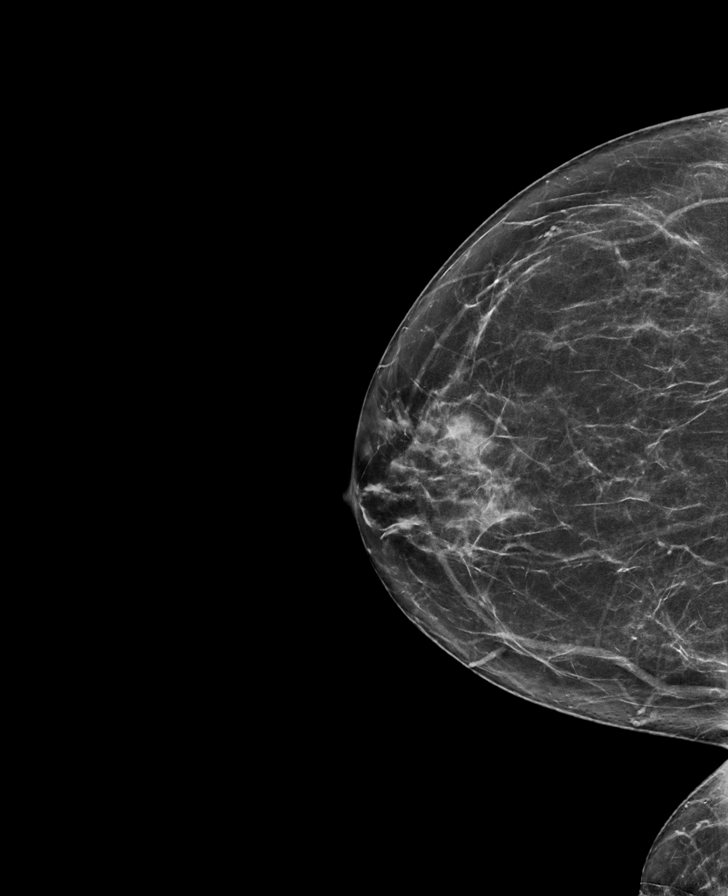

[R CC tomo · tomo slice 35/68.0]
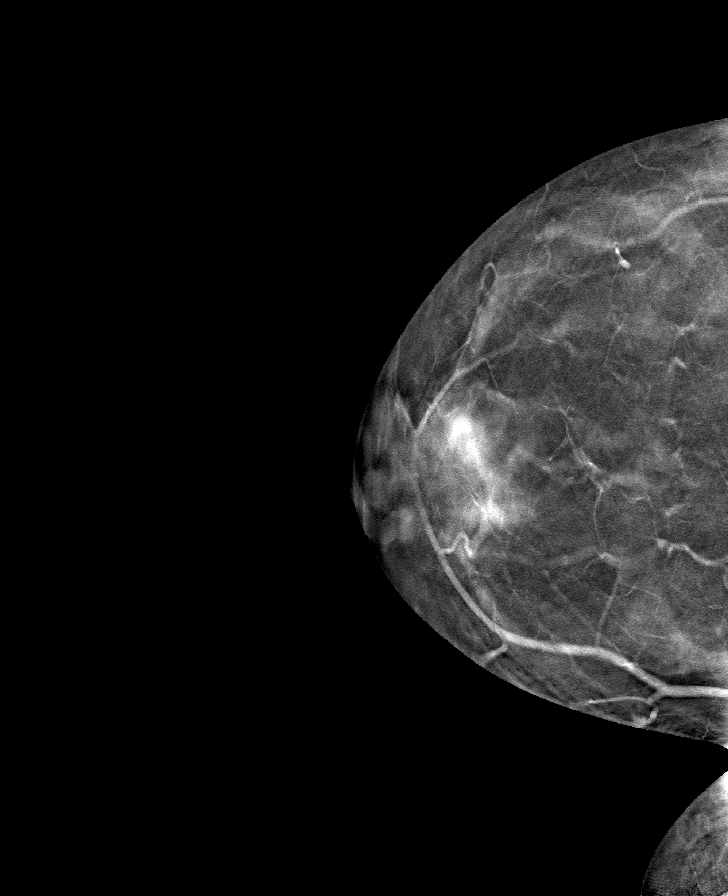

[R MLO tomo · tomo slice 41/82.0]
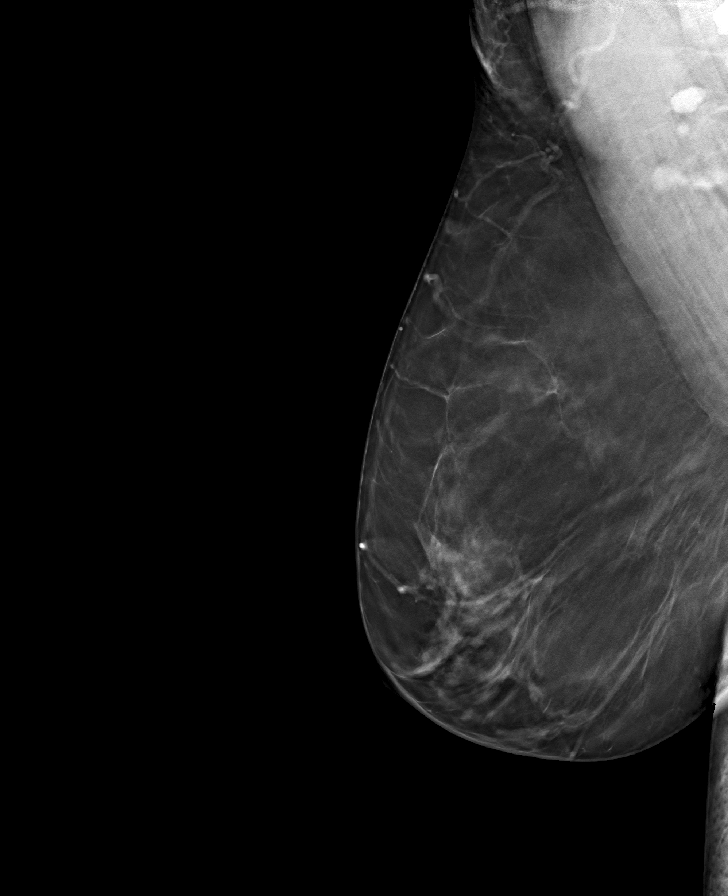

[L MLO tomo · tomo slice 40/79.0]
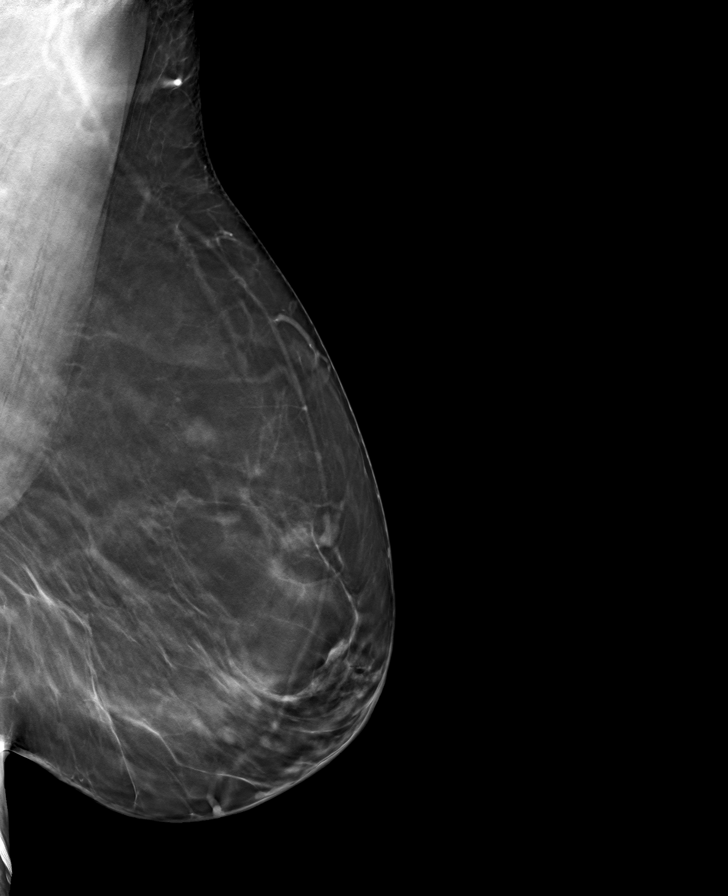

[L CC tomo · tomo slice 37/73.0]
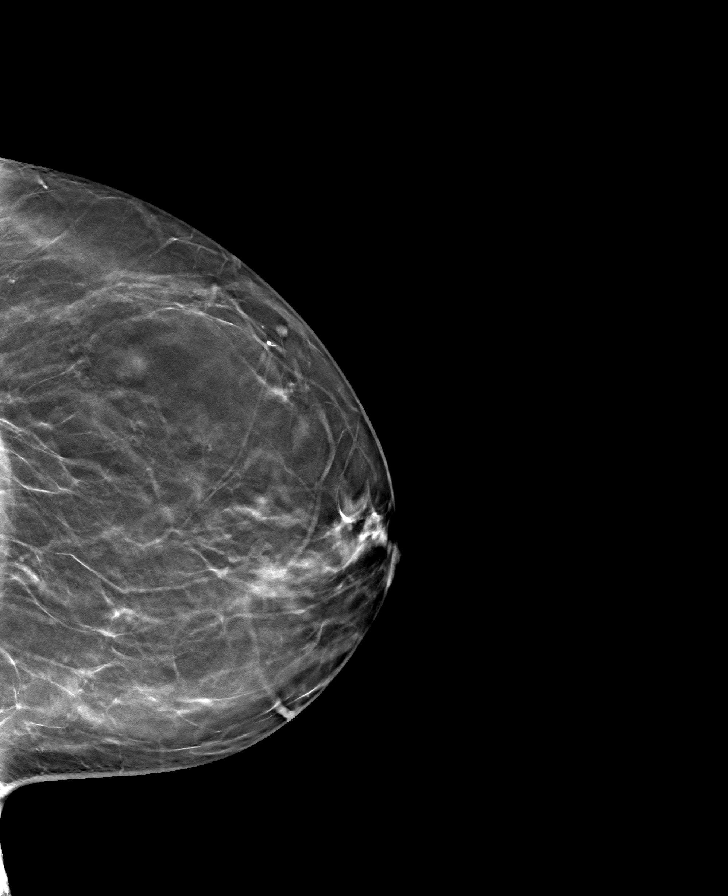

[8 of 24 positions shown; findings below may reference images not displayed]

ACR Breast Density Category b: There are scattered areas of
fibroglandular density.
FINDINGS: In the right breast , a possible asymmetry requires further
evaluation. This possible asymmetry is seen within the slightly
outer RIGHT breast, at anterior depth, cc slice 35.

In the left breast , a possible asymmetry requires further
evaluation. This possible asymmetry is seen within the upper-outer
quadrant of the LEFT breast, middle to posterior depth, cc slice 40
and MLO slice 51.

Images were processed with CAD.
IMPRESSION: Further evaluation is suggested for possible asymmetry in the right
breast.

Further evaluation is suggested for possible asymmetry in the left
breast.

RECOMMENDATION:
Diagnostic mammogram and possibly ultrasound of both breasts.
(Code:[id number])

The patient will be contacted regarding the findings, and additional
imaging will be scheduled.

BI-RADS CATEGORY  0: Incomplete. Need additional imaging evaluation
and/or prior mammograms for comparison.

## 2021-12-24 ENCOUNTER — Ambulatory Visit: Payer: 59 | Attending: Cardiovascular Disease | Admitting: Cardiovascular Disease

## 2021-12-24 ENCOUNTER — Encounter: Payer: Self-pay | Admitting: Cardiovascular Disease

## 2021-12-24 DIAGNOSIS — I350 Nonrheumatic aortic (valve) stenosis: Secondary | ICD-10-CM | POA: Diagnosis not present

## 2021-12-24 DIAGNOSIS — R5383 Other fatigue: Secondary | ICD-10-CM

## 2021-12-24 NOTE — Telephone Encounter (Signed)
Dr. Elmarie Shiley nurse Judson Roch came to me with the sleep study that we had ordered the pt back on 09/21/21. Pt stated to Dr. Acie Fredrickson today that she has made some lifestyle changes and feels better and did not think she needs the sleep study.. Dr. Cathie Olden was in agreement. I will un-register the device and put back into inventory.

## 2021-12-24 NOTE — Progress Notes (Signed)
Cardiology Office Note:    Date:  12/24/2021   ID:  Kathy Merritt, DOB 1970-04-03, MRN [id number]  PCP:  Minette Brine, [facility] Cardiologist:  Holland Nickson    Referring MD: Minette Brine, FNP   Chief Complaint  Patient presents with   Fatigue     History of Present Illness:    Kathy Merritt is a 51 y.o. female with a hx of anemia   Has irregl menstral cycles  Has not seem much improvement in her anemia   Has had her thyroid evaluated  Hb has been 11 for years  Iron levels overall look okay.  Her iron saturation level is 17 which is mildly low.  Her other iron levels including serum iron, TIBC, ferritin levels, U IBC are all normal.  No hx of sickle  cell or beta thal.  Wakes up fatigued, kidney just 30 seconds Can fall asleep very quickly  Does not snore to her knowledge sleep sleep study.    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  She did not have an upper endoscopy.  She does not report any significant upper abdominal pain. No reflux symptoms.  She is also seeing hematology.  Work up was unremarkable   Oct. 13, 2023   Kathy Merritt is seen today for follow up of her fatigue . ? OSA , has the itamar sleep study  Has cut out lots of carbs and she feels better   Has not started exercising ,  is walking at work now. Needs to make more time to exercise  Chronic anemia   Past Medical History:  Diagnosis Date   Anemia    Blood transfusion without reported diagnosis 2001   Elevated hemoglobin A1c    Fatigue    Pre-diabetes    Vitamin D deficiency     Past Surgical  History:  Procedure Laterality Date   CESAREAN SECTION  1995, 2001   x 2   TOOTH EXTRACTION     WISDOM TOOTH EXTRACTION      Current Medications: Current Meds  Medication Sig   ascorbic acid (VITAMIN C) 250 MG CHEW Chew 250 mg by mouth daily.   cholecalciferol (VITAMIN D) 25 MCG (1000 UNIT) tablet Take 1,000 Units by mouth daily. Takes 2-3 gummies every other day   magnesium 30 MG tablet Take 30 mg by mouth daily.   Multiple Vitamins-Minerals (MULTIVITAMIN WITH MINERALS) tablet Take 1 tablet by mouth daily. LIQUID   NON FORMULARY gymnema   Specialty Vitamins Products (COLLAGEN ULTRA PO) Take by mouth. With Biotin   VITAMIN E PO Take by mouth.   Current Facility-Administered Medications for the 12/24/21 encounter (Office Visit) with Stratton Villwock, Wonda Cheng, MD  Medication   0.9 %  sodium chloride infusion     Allergies:   Pine, Prednisone, and Latex   Social History   Socioeconomic History   Marital status: Divorced    Spouse name: Not on file   Number of children: 2   Years of education: Not on file   Highest education level: Not on file  Occupational History  Occupation: Clinic front office rep  Tobacco Use   Smoking status: Never   Smokeless tobacco: Never  Vaping Use   Vaping Use: Never used  Substance and Sexual Activity   Alcohol use: Not Currently   Drug use: No   Sexual activity: Not on file  Other Topics Concern   Not on file  Social History Narrative   Not on file   Social Determinants of Health   Financial Resource Strain: Not on file  Food Insecurity: Not on file  Transportation Needs: Not on file  Physical Activity: Not on file  Stress: Not on file  Social Connections: Not on file     Family History: The patient's family history includes Cancer in her father; Dementia in her maternal grandmother; Diabetes in her father; Healthy in her mother; Hypertension in her father. There is no history of Colon cancer, Colon polyps, Esophageal cancer, Rectal  cancer, or Stomach cancer.  ROS:   Please see the history of present illness.     All other systems reviewed and are negative.  EKGs/Labs/Other Studies Reviewed:    The following studies were reviewed today:     Recent Labs: 09/01/2021: ALT 18; BUN 11; Creatinine, Ser 0.67; Hemoglobin 11.1; Platelets 364; Potassium 5.2; Sodium 139; TSH 0.667  Recent Lipid Panel    Component Value Date/Time   CHOL 161 09/01/2021 0954   TRIG 98 09/01/2021 0954   HDL 49 09/01/2021 0954   CHOLHDL 3.3 09/01/2021 0954   LDLCALC 94 09/01/2021 0954     Risk Assessment/Calculations:      STOP-Bang Score:  2       Physical Exam:    Physical Exam: Blood pressure 120/78, pulse 78, height '5\' 6"'$  (1.676 m), weight 182 lb (82.6 kg), last menstrual period 07/18/2013, SpO2 98 %.       GEN:  Well nourished, well developed in no acute distress HEENT: Normal NECK: No JVD; No carotid bruits LYMPHATICS: No lymphadenopathy CARDIAC: RRR, no murmurs, rubs, gallops RESPIRATORY:  Clear to auscultation without rales, wheezing or rhonchi  ABDOMEN: Soft, non-tender, non-distended MUSCULOSKELETAL:  No edema; No deformity  SKIN: Warm and dry NEUROLOGIC:  Alert and oriented x 3   EKG:    ASSESSMENT:    1. Aortic valve stenosis, etiology of cardiac valve disease unspecified   2. Other fatigue     PLAN:       Fatigue: Her fatigue seems to be better as she improved her diet.  I encouraged her to continue with the good diet.  Have asked her to incorporate some exercise into her daily regimen.  2.  Anemia:    3.  Cardiac murmur:    has mild AS by echo.  Her gradient is minimal.  At this point I do not expect to worsen anytime soon.  We will  We will have her follow-up with [place] in 2 years for follow-up visit.   I have recommended that she exercise.  We have talked discussed reducing the carbohydrates in her diet.  Will see her in 2 years .     Medication Adjustments/Labs and Tests  Ordered: Current medicines are reviewed at length with the patient today.  Concerns regarding medicines are outlined above.  No orders of the defined types were placed in this encounter.  No orders of the defined types were placed in this encounter.   Patient Instructions  You have very mild aortic stenosis. This may worsen over time but we will keep an eye on that  with periodic echoes. It would be important to work on weight loss. It would be important to continue with a good healthy diet and to minimize carbohydrates.  Medication Instructions:  Your physician recommends that you continue on your current medications as directed. Please refer to the Current Medication list given to you today.  *If you need a refill on your cardiac medications before your next appointment, please call your pharmacy*   Lab Work: NONE If you have labs (blood work) drawn today and your tests are completely normal, you will receive your results only by: [place] (if you have MyChart) OR A paper copy in the mail If you have any lab test that is abnormal or we need to change your treatment, we will call you to review the results.   Testing/Procedures: **Returned Itamar sleep study is being unregistered**   Follow-Up: At [facility], you and your health needs are our priority.  As part of our continuing mission to provide you with exceptional heart care, we have created designated Provider Care Teams.  These Care Teams include your primary Cardiologist (physician) and Advanced Practice Providers (APPs -  Physician Assistants and Nurse Practitioners) who all work together to provide you with the care you need, when you need it.  Your next appointment:   2 year(s)  The format for your next appointment:   In Person  Provider:   Mertie Moores, MD    Important Information About Sugar         Signed, Mertie Moores, MD  12/24/2021 5:21 PM    [place]

## 2021-12-24 NOTE — Patient Instructions (Addendum)
You have very mild aortic stenosis. This may worsen over time but we will keep an eye on that with periodic echoes. It would be important to work on weight loss. It would be important to continue with a good healthy diet and to minimize carbohydrates.  Medication Instructions:  Your physician recommends that you continue on your current medications as directed. Please refer to the Current Medication list given to you today.  *If you need a refill on your cardiac medications before your next appointment, please call your pharmacy*   Lab Work: NONE If you have labs (blood work) drawn today and your tests are completely normal, you will receive your results only by: [place] (if you have MyChart) OR A paper copy in the mail If you have any lab test that is abnormal or we need to change your treatment, we will call you to review the results.   Testing/Procedures: **Returned Itamar sleep study is being unregistered**   Follow-Up: At [facility], you and your health needs are our priority.  As part of our continuing mission to provide you with exceptional heart care, we have created designated Provider Care Teams.  These Care Teams include your primary Cardiologist (physician) and Advanced Practice Providers (APPs -  Physician Assistants and Nurse Practitioners) who all work together to provide you with the care you need, when you need it.  Your next appointment:   2 year(s)  The format for your next appointment:   In Person  Provider:   Mertie Moores, MD    Important Information About Sugar

## 2022-01-04 ENCOUNTER — Ambulatory Visit: Payer: 59 | Admitting: Nurse Practitioner

## 2022-02-21 ENCOUNTER — Other Ambulatory Visit: Payer: Self-pay | Admitting: Nurse Practitioner

## 2022-02-21 DIAGNOSIS — N6320 Unspecified lump in the left breast, unspecified quadrant: Secondary | ICD-10-CM

## 2022-04-27 ENCOUNTER — Other Ambulatory Visit: Payer: Self-pay

## 2022-05-04 ENCOUNTER — Encounter: Payer: Self-pay | Admitting: Nurse Practitioner

## 2022-06-15 ENCOUNTER — Ambulatory Visit
Admission: RE | Admit: 2022-06-15 | Discharge: 2022-06-15 | Disposition: A | Discharge status: Home / Self Care | Payer: Commercial Managed Care - PPO | Source: Ambulatory Visit | Attending: Nurse Practitioner | Admitting: Nurse Practitioner

## 2022-06-15 DIAGNOSIS — N6320 Unspecified lump in the left breast, unspecified quadrant: Secondary | ICD-10-CM

## 2022-06-15 DIAGNOSIS — N6321 Unspecified lump in the left breast, upper outer quadrant: Secondary | ICD-10-CM | POA: Diagnosis not present

## 2022-06-15 DIAGNOSIS — R928 Other abnormal and inconclusive findings on diagnostic imaging of breast: Secondary | ICD-10-CM | POA: Diagnosis not present

## 2022-07-12 ENCOUNTER — Encounter: Payer: Commercial Managed Care - PPO | Admitting: Nurse Practitioner

## 2022-07-19 ENCOUNTER — Encounter: Payer: 59 | Admitting: Nurse Practitioner

## 2022-08-17 ENCOUNTER — Encounter: Payer: Self-pay | Admitting: Nurse Practitioner

## 2022-08-17 ENCOUNTER — Ambulatory Visit (INDEPENDENT_AMBULATORY_CARE_PROVIDER_SITE_OTHER): Payer: Commercial Managed Care - PPO | Admitting: Nurse Practitioner

## 2022-08-17 VITALS — BP 120/80 | HR 65 | Temp 98.9°F | Ht 65.0 in | Wt 169.6 lb

## 2022-08-17 DIAGNOSIS — Z2821 Immunization not carried out because of patient refusal: Secondary | ICD-10-CM

## 2022-08-17 DIAGNOSIS — Z01419 Encounter for gynecological examination (general) (routine) without abnormal findings: Secondary | ICD-10-CM

## 2022-08-17 DIAGNOSIS — R946 Abnormal results of thyroid function studies: Secondary | ICD-10-CM | POA: Diagnosis not present

## 2022-08-17 DIAGNOSIS — R5383 Other fatigue: Secondary | ICD-10-CM

## 2022-08-17 DIAGNOSIS — Z0000 Encounter for general adult medical examination without abnormal findings: Secondary | ICD-10-CM | POA: Diagnosis not present

## 2022-08-17 DIAGNOSIS — Z780 Asymptomatic menopausal state: Secondary | ICD-10-CM

## 2022-08-17 DIAGNOSIS — Z13220 Encounter for screening for lipoid disorders: Secondary | ICD-10-CM

## 2022-08-17 DIAGNOSIS — R7309 Other abnormal glucose: Secondary | ICD-10-CM | POA: Diagnosis not present

## 2022-08-17 NOTE — Patient Instructions (Signed)

## 2022-08-17 NOTE — Progress Notes (Signed)
Hershal Coria Martin,acting as a Neurosurgeon for Arnette Felts, FNP.,have documented all relevant documentation on the behalf of Arnette Felts, FNP,as directed by  Arnette Felts, FNP while in the presence of Arnette Felts, FNP.   Subjective:     Patient ID: Kathy Merritt , female    DOB: [birth year] , 52 y.o.   MRN: [id number]   Chief Complaint  Patient presents with   Annual Exam    HPI  Patient presents today for HM. Patient reports compliance with medications and has no other concerns today. Patient denies any chest pain, SOB, or headaches. She has seen Endo for her thyroid but has not seen in a while.   BP Readings from Last 3 Encounters: 08/17/22 : 120/80 12/24/21 : 120/78 10/27/21 : 120/70  Wt Readings from Last 3 Encounters: 08/17/22 : 169 lb 9.6 oz (76.9 kg) 12/24/21 : 182 lb (82.6 kg) 10/27/21 : 185 lb (83.9 kg)     Past Medical History:  Diagnosis Date   Allergy    Anemia    Blood transfusion without reported diagnosis 2001   Elevated hemoglobin A1c    Fatigue    Pre-diabetes    Vitamin D deficiency      Family History  Problem Relation Age of Onset   Healthy Mother    Diabetes Father    Hypertension Father    Cancer Father        liver and gallbladder   Dementia Maternal Grandmother    Colon cancer Neg Hx    Colon polyps Neg Hx    Esophageal cancer Neg Hx    Rectal cancer Neg Hx    Stomach cancer Neg Hx      Current Outpatient Medications:    ascorbic acid (VITAMIN C) 250 MG CHEW, Chew 250 mg by mouth daily., Disp: , Rfl:    cholecalciferol (VITAMIN D) 25 MCG (1000 UNIT) tablet, Take 1,000 Units by mouth daily. Takes 2-3 gummies every other day, Disp: , Rfl:    magnesium 30 MG tablet, Take 30 mg by mouth daily., Disp: , Rfl:    Multiple Vitamins-Minerals (MULTIVITAMIN WITH MINERALS) tablet, Take 1 tablet by mouth daily. LIQUID, Disp: , Rfl:    Specialty Vitamins Products (COLLAGEN ULTRA PO), Take by mouth. With Biotin, Disp: , Rfl:    VITAMIN E PO,  Take by mouth., Disp: , Rfl:   Current Facility-Administered Medications:    0.9 %  sodium chloride infusion, 500 mL, Intravenous, Once, Mansouraty, Netty Starring., MD   Allergies  Allergen Reactions   Pine Shortness Of Breath    wheezing   Prednisone    Latex Itching and Rash      The patient states she uses post menopausal status for birth control.  Patient's last menstrual period was 07/18/2013.  Negative for Dysmenorrhea and Negative for Menorrhagia. Negative for: breast discharge, breast lump(s), breast pain and breast self exam. Associated symptoms include abnormal vaginal bleeding. Pertinent negatives include abnormal bleeding (hematology), anxiety, decreased libido, depression, difficulty falling sleep, dyspareunia, history of infertility, nocturia, sexual dysfunction, sleep disturbances, urinary incontinence, urinary urgency, vaginal discharge and vaginal itching. Diet regular; she has been eating more salads and vegetables. She will do this everyday if has a big supply. She is getting better with her water intake. The patient states her exercise level is minimal 1-3 times a week with walking, low impact exercises.   The patient's tobacco use is:  Social History   Tobacco Use  Smoking Status Never  Smokeless Tobacco Never  .  She has been exposed to passive smoke. The patient's alcohol use is:  Social History   Substance and Sexual Activity  Alcohol Use Not Currently   Additional information: Last pap 08/27/2020, next one scheduled for 08/28/2023.    Review of Systems  Constitutional:  Positive for fatigue.  HENT: Negative.    Eyes: Negative.   Respiratory: Negative.    Cardiovascular: Negative.   Gastrointestinal: Negative.   Endocrine: Negative.   Genitourinary: Negative.   Musculoskeletal: Negative.   Skin: Negative.   Allergic/Immunologic: Negative.   Neurological: Negative.   Hematological: Negative.   Psychiatric/Behavioral: Negative.       Today's Vitals    08/17/22 1438  BP: 120/80  Pulse: 65  Temp: 98.9 F (37.2 C)  TempSrc: Oral  Weight: 169 lb 9.6 oz (76.9 kg)  Height: 5\' 5"  (1.651 m)  PainSc: 0-No pain   Body mass index is 28.22 kg/m.  Wt Readings from Last 3 Encounters:  08/17/22 169 lb 9.6 oz (76.9 kg)  12/24/21 182 lb (82.6 kg)  10/27/21 185 lb (83.9 kg)    Objective:  Physical Exam Vitals reviewed.  Constitutional:      General: She is not in acute distress.    Appearance: Normal appearance. She is well-developed. She is obese.  HENT:     Head: Normocephalic and atraumatic.     Right Ear: Hearing, tympanic membrane, ear canal and external ear normal. There is no impacted cerumen.     Left Ear: Hearing, tympanic membrane, ear canal and external ear normal. There is no impacted cerumen.     Nose: Nose normal.     Mouth/Throat:     Mouth: Mucous membranes are moist.  Eyes:     General: Lids are normal.     Extraocular Movements: Extraocular movements intact.     Conjunctiva/sclera: Conjunctivae normal.     Pupils: Pupils are equal, round, and reactive to light.     Funduscopic exam:    Right eye: No papilledema.        Left eye: No papilledema.  Neck:     Thyroid: No thyroid mass.     Vascular: No carotid bruit.  Cardiovascular:     Rate and Rhythm: Normal rate and regular rhythm.     Pulses: Normal pulses.     Heart sounds: Normal heart sounds. No murmur heard. Pulmonary:     Effort: Pulmonary effort is normal. No respiratory distress.     Breath sounds: Normal breath sounds. No wheezing.  Chest:     Chest wall: No mass.  Breasts:    Tanner Score is 5.     Right: Normal. No mass or tenderness.     Left: Normal. No mass or tenderness.  Abdominal:     General: Abdomen is flat. Bowel sounds are normal. There is no distension.     Palpations: Abdomen is soft.     Tenderness: There is no abdominal tenderness.  Genitourinary:    Comments: NA - referral made to GYN Musculoskeletal:        General: No  swelling. Normal range of motion.     Cervical back: Full passive range of motion without pain, normal range of motion and neck supple.     Right lower leg: No edema.     Left lower leg: No edema.  Lymphadenopathy:     Upper Body:     Right upper body: No supraclavicular, axillary or pectoral adenopathy.     Left upper body: No supraclavicular, axillary  or pectoral adenopathy.  Skin:    General: Skin is warm and dry.     Capillary Refill: Capillary refill takes less than 2 seconds.  Neurological:     General: No focal deficit present.     Mental Status: She is alert and oriented to person, place, and time.     Cranial Nerves: No cranial nerve deficit.     Sensory: No sensory deficit.     Motor: No weakness.  Psychiatric:        Mood and Affect: Mood normal.        Behavior: Behavior normal.        Thought Content: Thought content normal.        Judgment: Judgment normal.         Assessment And Plan:     1. Encounter for annual health examination Behavior modifications discussed and diet history reviewed.   Pt will continue to exercise regularly and modify diet with low GI, plant based foods and decrease intake of processed foods.  Recommend intake of daily multivitamin, Vitamin D, and calcium.  Recommend mammogram and colonoscopy for preventive screenings, as well as recommend immunizations that include influenza, TDAP, and Shingles Health Maintenance  Topic Date Due   Pap Smear  Never done   COVID-19 Vaccine (2 - Janssen risk series) 09/02/2022*   Flu Shot  10/13/2022   Mammogram  06/14/2024   DTaP/Tdap/Td vaccine (2 - Td or Tdap) 12/18/2029   Colon Cancer Screening  10/31/2030   Hepatitis C Screening  Completed   HIV Screening  Completed   Zoster (Shingles) Vaccine  Completed   HPV Vaccine  Aged Out  *Topic was postponed. The date shown is not the original due date.    2. Abnormal glucose Comments: HgbA1c was elevated at last visit. Encouraged to focus on healthy  diet low in sugar and starches. Continue exercising at least 150 min/week - Hemoglobin A1c  3. Other fatigue Comments: This may be related to menopause however will check for metabolic causes. - CBC with Differential/Platelet - TSH - Iron, TIBC and Ferritin Panel - Vitamin D (25 hydroxy) - Vitamin B12 - FSH/LH - Estradiol  4. Menopause Comments: Will check hormone levels. The likelyhood of being in menopause is high due to age. Encouraged to limit intake of sweets and eating late at night. - FSH/LH - Estradiol  5. Abnormal thyroid function test - TSH + free T4  6. COVID-19 vaccination declined Declines covid 19 vaccine. Discussed risk of covid 8 and if she changes her mind about the vaccine to call the office. Education has been provided regarding the importance of this vaccine but patient still declined. Advised may receive this vaccine at local pharmacy or Health Dept.or vaccine clinic. Aware to provide a copy of the vaccination record if obtained from local pharmacy or Health Dept.  Encouraged to take multivitamin, vitamin d, vitamin c and zinc to increase immune system. Aware can call office if would like to have vaccine here at office. Verbalized acceptance and understanding.  7. Encounter for screening for lipid disorder - CMP14+EGFR - Lipid panel  8. Encounter for gynecological examination - Ambulatory referral to Gynecology   Return for 1 year physical. Patient was given opportunity to ask questions. Patient verbalized understanding of the plan and was able to repeat key elements of the plan. All questions were answered to their satisfaction.   Arnette Felts, FNP   I, Arnette Felts, FNP, have reviewed all documentation for this visit. The documentation  on 08/17/22 for the exam, diagnosis, procedures, and orders are all accurate and complete.   THE PATIENT IS ENCOURAGED TO PRACTICE SOCIAL DISTANCING DUE TO THE COVID-19 PANDEMIC.

## 2022-08-18 LAB — HEMOGLOBIN A1C
Est. average glucose Bld gHb Est-mCnc: 140 mg/dL
Hgb A1c MFr Bld: 6.5 % — ABNORMAL HIGH (ref 4.8–5.6)

## 2022-08-18 LAB — CMP14+EGFR
ALT: 13 IU/L (ref 0–32)
AST: 11 IU/L (ref 0–40)
Albumin/Globulin Ratio: 1.5 (ref 1.2–2.2)
Albumin: 4.1 g/dL (ref 3.8–4.9)
Alkaline Phosphatase: 67 IU/L (ref 44–121)
BUN/Creatinine Ratio: 17 (ref 9–23)
BUN: 11 mg/dL (ref 6–24)
Bilirubin Total: 0.4 mg/dL (ref 0.0–1.2)
CO2: 26 mmol/L (ref 20–29)
Calcium: 9 mg/dL (ref 8.7–10.2)
Chloride: 102 mmol/L (ref 96–106)
Creatinine, Ser: 0.64 mg/dL (ref 0.57–1.00)
Globulin, Total: 2.8 g/dL (ref 1.5–4.5)
Glucose: 81 mg/dL (ref 70–99)
Potassium: 4 mmol/L (ref 3.5–5.2)
Sodium: 140 mmol/L (ref 134–144)
Total Protein: 6.9 g/dL (ref 6.0–8.5)
eGFR: 107 mL/min/{1.73_m2} (ref >59)

## 2022-08-18 LAB — IRON,TIBC AND FERRITIN PANEL
Ferritin: 135 ng/mL (ref 15–150)
Iron Saturation: 21 % (ref 15–55)
Iron: 76 ug/dL (ref 27–159)
Total Iron Binding Capacity: 360 ug/dL (ref 250–450)
UIBC: 284 ug/dL (ref 131–425)

## 2022-08-18 LAB — CBC WITH DIFFERENTIAL/PLATELET
Basophils Absolute: 0 10*3/uL (ref 0.0–0.2)
Basos: 0 %
EOS (ABSOLUTE): 0.1 10*3/uL (ref 0.0–0.4)
Eos: 1 %
Hematocrit: 35.1 % (ref 34.0–46.6)
Hemoglobin: 10.9 g/dL — ABNORMAL LOW (ref 11.1–15.9)
Immature Grans (Abs): 0 10*3/uL (ref 0.0–0.1)
Immature Granulocytes: 0 %
Lymphocytes Absolute: 2.5 10*3/uL (ref 0.7–3.1)
Lymphs: 45 %
MCH: 27.5 pg (ref 26.6–33.0)
MCHC: 31.1 g/dL — ABNORMAL LOW (ref 31.5–35.7)
MCV: 88 fL (ref 79–97)
Monocytes Absolute: 0.3 10*3/uL (ref 0.1–0.9)
Monocytes: 5 %
Neutrophils Absolute: 2.7 10*3/uL (ref 1.4–7.0)
Neutrophils: 49 %
Platelets: 307 10*3/uL (ref 150–450)
RBC: 3.97 x10E6/uL (ref 3.77–5.28)
RDW: 13.1 % (ref 11.7–15.4)
WBC: 5.5 10*3/uL (ref 3.4–10.8)

## 2022-08-18 LAB — VITAMIN D 25 HYDROXY (VIT D DEFICIENCY, FRACTURES): Vit D, 25-Hydroxy: 27.8 ng/mL — ABNORMAL LOW (ref 30.0–100.0)

## 2022-08-18 LAB — LIPID PANEL
Chol/HDL Ratio: 2.5 ratio (ref 0.0–4.4)
Cholesterol, Total: 157 mg/dL (ref 100–199)
HDL: 64 mg/dL (ref >39)
LDL Chol Calc (NIH): 81 mg/dL (ref 0–99)
Triglycerides: 58 mg/dL (ref 0–149)
VLDL Cholesterol Cal: 12 mg/dL (ref 5–40)

## 2022-08-18 LAB — ESTRADIOL: Estradiol: 20.3 pg/mL

## 2022-08-18 LAB — VITAMIN B12: Vitamin B-12: 658 pg/mL (ref 232–1245)

## 2022-08-18 LAB — TSH+FREE T4
Free T4: 1.16 ng/dL (ref 0.82–1.77)
TSH: 0.355 u[IU]/mL — ABNORMAL LOW (ref 0.450–4.500)

## 2022-08-18 LAB — FSH/LH
FSH: 42.7 m[IU]/mL
LH: 28.3 m[IU]/mL

## 2022-08-21 DIAGNOSIS — Z780 Asymptomatic menopausal state: Secondary | ICD-10-CM | POA: Insufficient documentation

## 2022-08-21 DIAGNOSIS — R946 Abnormal results of thyroid function studies: Secondary | ICD-10-CM | POA: Insufficient documentation

## 2022-08-21 MED ORDER — VITAMIN D (ERGOCALCIFEROL) 1.25 MG (50000 UNIT) PO CAPS: 50000.0000 [IU] | ORAL_CAPSULE | ORAL | 1 refills | Status: DC

## 2022-08-27 ENCOUNTER — Encounter: Payer: Self-pay | Admitting: Nurse Practitioner

## 2022-09-05 ENCOUNTER — Encounter: Payer: 59 | Admitting: Nurse Practitioner

## 2022-10-26 ENCOUNTER — Ambulatory Visit: Payer: Commercial Managed Care - PPO | Admitting: Nurse Practitioner

## 2022-11-30 ENCOUNTER — Encounter: Payer: Self-pay | Admitting: Nurse Practitioner

## 2022-11-30 ENCOUNTER — Ambulatory Visit (INDEPENDENT_AMBULATORY_CARE_PROVIDER_SITE_OTHER): Payer: Commercial Managed Care - PPO | Admitting: Nurse Practitioner

## 2022-11-30 VITALS — BP 120/84 | HR 77 | Temp 98.5°F | Ht 65.0 in | Wt 172.6 lb

## 2022-11-30 DIAGNOSIS — R5383 Other fatigue: Secondary | ICD-10-CM

## 2022-11-30 DIAGNOSIS — R7309 Other abnormal glucose: Secondary | ICD-10-CM | POA: Diagnosis not present

## 2022-11-30 DIAGNOSIS — J029 Acute pharyngitis, unspecified: Secondary | ICD-10-CM

## 2022-11-30 DIAGNOSIS — Z6828 Body mass index (BMI) 28.0-28.9, adult: Secondary | ICD-10-CM | POA: Diagnosis not present

## 2022-11-30 DIAGNOSIS — R519 Headache, unspecified: Secondary | ICD-10-CM

## 2022-11-30 DIAGNOSIS — R946 Abnormal results of thyroid function studies: Secondary | ICD-10-CM

## 2022-11-30 DIAGNOSIS — Z2821 Immunization not carried out because of patient refusal: Secondary | ICD-10-CM | POA: Diagnosis not present

## 2022-11-30 DIAGNOSIS — Z01419 Encounter for gynecological examination (general) (routine) without abnormal findings: Secondary | ICD-10-CM

## 2022-11-30 NOTE — Patient Instructions (Addendum)
You can call DR Normand Sloop [phone] for your GYN care, this is where I sent the referral in June.  Cut back on your sugary foods and heavy carbs this can make your menopause symptoms worse as well as your fatigue  Avoid eating late this can also affect your menopause symptoms such as hot flashes.

## 2022-12-01 LAB — HEMOGLOBIN A1C
Est. average glucose Bld gHb Est-mCnc: 128 mg/dL
Hgb A1c MFr Bld: 6.1 % — ABNORMAL HIGH (ref 4.8–5.6)

## 2022-12-01 LAB — TSH+FREE T4
Free T4: 1.08 ng/dL (ref 0.82–1.77)
TSH: 0.432 u[IU]/mL — ABNORMAL LOW (ref 0.450–4.500)

## 2022-12-11 DIAGNOSIS — R519 Headache, unspecified: Secondary | ICD-10-CM | POA: Insufficient documentation

## 2022-12-11 DIAGNOSIS — J029 Acute pharyngitis, unspecified: Secondary | ICD-10-CM | POA: Insufficient documentation

## 2022-12-11 DIAGNOSIS — Z2821 Immunization not carried out because of patient refusal: Secondary | ICD-10-CM | POA: Insufficient documentation

## 2022-12-11 DIAGNOSIS — R7309 Other abnormal glucose: Secondary | ICD-10-CM | POA: Insufficient documentation

## 2022-12-11 NOTE — Assessment & Plan Note (Signed)
Will check for metabolic causes again

## 2022-12-11 NOTE — Assessment & Plan Note (Signed)
HgbA1c is stable. Continue focusing on healthy diet low in sugar and starches

## 2022-12-11 NOTE — Assessment & Plan Note (Signed)
TSH is overactive, will refer to endocrinology for further evaluation

## 2022-12-11 NOTE — Assessment & Plan Note (Signed)

## 2022-12-11 NOTE — Assessment & Plan Note (Signed)
Can take tylenol as needed. Make sure to stay well hydrated with water.

## 2022-12-11 NOTE — Assessment & Plan Note (Signed)
Encouraged to do warm salt water gargles.

## 2022-12-12 LAB — POC SOFIA 2 FLU + SARS ANTIGEN FIA
Influenza A, POC: NEGATIVE
Influenza B, POC: NEGATIVE
SARS Coronavirus 2 Ag: NEGATIVE

## 2022-12-12 LAB — POCT RAPID STREP A (OFFICE): Rapid Strep A Screen: NEGATIVE

## 2023-02-15 ENCOUNTER — Encounter: Payer: Self-pay | Admitting: Internal Medicine

## 2023-02-15 ENCOUNTER — Ambulatory Visit: Payer: Commercial Managed Care - PPO | Admitting: Internal Medicine

## 2023-02-15 VITALS — BP 136/80 | HR 66 | Ht 65.0 in | Wt 178.0 lb

## 2023-02-15 DIAGNOSIS — E0590 Thyrotoxicosis, unspecified without thyrotoxic crisis or storm: Secondary | ICD-10-CM

## 2023-02-15 DIAGNOSIS — R5383 Other fatigue: Secondary | ICD-10-CM

## 2023-02-15 DIAGNOSIS — E559 Vitamin D deficiency, unspecified: Secondary | ICD-10-CM | POA: Diagnosis not present

## 2023-02-15 NOTE — Patient Instructions (Signed)
Please stop at the lab.  Please return in 1 year.  

## 2023-02-15 NOTE — Progress Notes (Unsigned)
Patient ID: Kathy Merritt, female   DOB: 11-08-1970, 52 y.o.   MRN: [id number]   HPI  Kathy Merritt is a 52 y.o.-year-old female, referred again by her PCP, Arnette Felts, FNP, for evaluation and management of a low TSH.  I previously saw the patient for the same problem, with the last visit being 2.5 years ago.  Interim history: She denies tremors, palpitations (had 1-2 episodes, 1 year ago), anxiety, weight loss, heat intolerance(has hot flushes - chronic), increased stool frequency.  However, she does complain of fatigue, more increased recently.  Reviewed history: She describes that 21 years ago, after the birth of her son (2001) >> TSH was a little abnormal, but she could not follow up for this 2/2 loss of insurance.   She finally obtained insurance and established care with a PCP in 2022.  A TSH returned slightly low.  She was referred to endocrinology. Before our visit from 07/2020, she completed a 40 day liquid fast (for religious reasons) >> lost ~25 lbs and she felt much better.  I reviewed pt's thyroid tests: Lab Results  Component Value Date   TSH 0.432 (L) 11/30/2022   TSH 0.355 (L) 08/17/2022   TSH 0.667 09/01/2021   TSH 0.31 (L) 09/04/2020   TSH 0.24 (L) 07/23/2020   TSH 0.358 (L) 05/20/2020   FREET4 1.08 11/30/2022   FREET4 1.16 08/17/2022   FREET4 0.74 09/04/2020   FREET4 0.77 07/23/2020   T3FREE 2.7 09/01/2021   T3FREE 3.7 09/04/2020   T3FREE 3.1 07/23/2020   Antithyroid antibodies: Lab Results  Component Value Date   TSI <89 07/23/2020   Pt denies: - feeling nodules in neck - hoarseness - dysphagia - choking - SOB with lying down  At our visit from 07/2020, she mentioned: - + fatigue - + hair loss  - + previously weight gain, but then weight loss doing her weekly fast - + hot flushes Hair loss improved, but it is thin.  Pt does have a FH of thyroid ds. In mother - Hyperthyroidism. No FH of thyroid cancer. No h/o radiation tx to head or  neck.  Of note, at last visit, she mentions being on: - Iodine - off and on iodine supplements 2020-2022.  - Ashwaganda tablets and tea   - 5000 mcg of biotin daily I advised her to stay off these. She is off these now.  No seaweed or kelp, no recent contrast studies. No recent steroid use.  She cannot use steroids due to history of severe constipation.  Pt. also has a history of irregular menstrual cycles >> LMP 04/2015, also, high ferritin, anemia. She also has prediabetes-PCP recommended dietary changes, increase physical activity -she started her fast afterwards.  HbA1c reviewed: Lab Results  Component Value Date   HGBA1C 6.1 (H) 11/30/2022  She is trying to be as active as possible at work.  ROS: + see HPI  Past Medical History:  Diagnosis Date   Allergy    Anemia    Blood transfusion without reported diagnosis 2001   Elevated hemoglobin A1c    Fatigue    Pre-diabetes    Vitamin D deficiency    Past Surgical History:  Procedure Laterality Date   CESAREAN SECTION  1995, 2001   x 2   TOOTH EXTRACTION     WISDOM TOOTH EXTRACTION     Social History   Socioeconomic History   Marital status: Single    Spouse name: Not on file   Number of children: 2  Years of education: Not on file   Highest education level: Not on file  Occupational History   Occupation: Clinic front office rep  Tobacco Use   Smoking status: Never   Smokeless tobacco: Never  Vaping Use   Vaping status: Never Used  Substance and Sexual Activity   Alcohol use: Not Currently   Drug use: No   Sexual activity: Not Currently    Birth control/protection: Abstinence  Other Topics Concern   Not on file  Social History Narrative   Not on file   Social Determinants of Health   Financial Resource Strain: Medium Risk (11/30/2022)   Overall Financial Resource Strain (CARDIA)    Difficulty of Paying Living Expenses: Somewhat hard  Food Insecurity: No Food Insecurity (11/30/2022)   Hunger Vital Sign     Worried About Running Out of Food in the Last Year: Never true    Ran Out of Food in the Last Year: Never true  Transportation Needs: No Transportation Needs (11/30/2022)   PRAPARE - Administrator, Civil Service (Medical): No    Lack of Transportation (Non-Medical): No  Physical Activity: Sufficiently Active (11/30/2022)   Exercise Vital Sign    Days of Exercise per Week: 6 days    Minutes of Exercise per Session: 40 min  Stress: No Stress Concern Present (11/30/2022)   [employer] of Occupational Health - Occupational Stress Questionnaire    Feeling of Stress : Only a little  Social Connections: Moderately Integrated (11/30/2022)   Social Connection and Isolation Panel [NHANES]    Frequency of Communication with Friends and Family: More than three times a week    Frequency of Social Gatherings with Friends and Family: More than three times a week    Attends Religious Services: More than 4 times per year    Active Member of [employer] or Organizations: Yes    Attends Engineer, structural: More than 4 times per year    Marital Status: Divorced  Catering manager Violence: Not on file   Current Outpatient Medications on File Prior to Visit  Medication Sig Dispense Refill   ascorbic acid (VITAMIN C) 250 MG CHEW Chew 250 mg by mouth daily.     cholecalciferol (VITAMIN D) 25 MCG (1000 UNIT) tablet Take 1,000 Units by mouth daily. Takes 2-3 gummies every other day     magnesium 30 MG tablet Take 30 mg by mouth daily.     Multiple Vitamins-Minerals (MULTIVITAMIN WITH MINERALS) tablet Take 1 tablet by mouth daily. LIQUID     Specialty Vitamins Products (COLLAGEN ULTRA PO) Take by mouth. With Biotin     Vitamin D, Ergocalciferol, (DRISDOL) 1.25 MG (50000 UNIT) CAPS capsule Take 1 capsule (50,000 Units total) by mouth every 7 (seven) days. 12 capsule 1   VITAMIN E PO Take by mouth.     Current Facility-Administered Medications on File Prior to Visit  Medication Dose Route  Frequency Provider Last Rate Last Admin   0.9 %  sodium chloride infusion  500 mL Intravenous Once Mansouraty, Netty Starring., MD       Allergies  Allergen Reactions   Pine Shortness Of Breath    wheezing   Prednisone    Latex Itching and Rash   Family History  Problem Relation Age of Onset   Healthy Mother    Diabetes Father    Hypertension Father    Cancer Father        liver and gallbladder   Dementia Maternal Grandmother  Colon cancer Neg Hx    Colon polyps Neg Hx    Esophageal cancer Neg Hx    Rectal cancer Neg Hx    Stomach cancer Neg Hx    PE: BP 136/80   Pulse 66   Ht 5\' 5"  (1.651 m)   Wt 178 lb (80.7 kg)   LMP 07/18/2013   SpO2 96%   BMI 29.62 kg/m  Wt Readings from Last 3 Encounters:  02/15/23 178 lb (80.7 kg)  11/30/22 172 lb 9.6 oz (78.3 kg)  08/17/22 169 lb 9.6 oz (76.9 kg)   Constitutional: overweight, in NAD Eyes:  EOMI, no exophthalmos ENT: no neck masses, no cervical lymphadenopathy Cardiovascular: RRR, No MRG Respiratory: CTA B Musculoskeletal: no deformities Skin:no rashes Neurological: no tremor with outstretched hands  ASSESSMENT: 1.  Subclinical thyrotoxicosis  2.  Fatigue  3. Vitamin D insufficiency  PLAN:  1. Patient with history of a low TSH, without thyrotoxic symptoms: No unintentional weight loss, heat intolerance (except hot flashes due to menopause), hyperdefecation, palpitations, anxiety.  I first saw the patient in 07/2020 for the same condition.  At that time, she had several possible exogenous causes for her low TSH: Iodine supplements, biotin, ashwagandha.  I advised her to stay away iodine, ashwagandha and also to stop biotin at least a week prior to hormonal labs.  At last visit, she was off the supplements and we rechecked her TFTs.  The TSH was very mildly low, and 0.24, while free T4 and free T3 levels were normal.  A TSI antibody level was also normal.  Therefore, our working diagnosis was either mild Graves' disease or  subacute thyroiditis, possibly resulting.  We discussed about keeping an eye on the thyroid tests but I did not recommend intervention. -After last visit, TSH improved to normal in 08/2021, but she had 2 levels obtained this year which was mildly low, only slightly under the lower limit of the target range.  Of note, free T4 and free T3 levels remain normal. -we discussed about possible endogenous causes for thyrotoxicosis to include Graves' disease, thyroiditis, and toxic multinodular goiter/toxic adenoma. -At today's visit, she does not describe thyrotoxic symptoms but she does have more fatigue.  We discussed that this could be related to many etiologies: She has mild anemia, she is also menopausal, she has aortic stenosis, prediabetes and vitamin D insufficiency.  A B12 level was normal in 08/2022 per review of labs obtained from PCP.  She went to [facility] who offered testosterone replacement and she used this for 1 month but developed increased hirsutism and deepening of the voice and stopped.  She is not interested in restarting this.  We did discuss about possibly trying hormone replacement therapy-she has an appointment with OB/GYN coming up next month and I advised her to discuss with the provider about whether she can use this or not, especially as she also has hot flashes.  She agrees with this. -we discussed about rechecking her TFTs and obtaining a thyroid uptake and scan if still abnormal and worsening this can differentiate between the above abnormalities -Depending on the etiology, we could follow her without treatment (I would favor this approach due to the very mild nature of the thyroid abnormality), use methimazole or radioactive iodine treatment.  I would not recommend thyroidectomy for this mild degree of subclinical thyrotoxicosis. -At today's visit, we do not need beta-blockers as she is not tachycardic or tremulous -no signs of active Graves' ophthalmopathy to include double  vision, blurry vision, eye pain, chemosis -Will have her return in a year for follow-up, but may need to change the plan depending on the results today  3. Vitamin D insufficiency - on vit D in MVI (plnt - 400 units) + 5000 units - sporadically - latest level was slightly low: Lab Results  Component Value Date   VD25OH 27.8 (L) 08/17/2022  -She was recommended ergocalciferol but did not get this yet. -will check a vit D level today and see if she needs further supplementation, although, she is on a hefty dose and may need to reduce it if she starts taking it daily.  - time spent for the visit: 40 minutes, of which >50% was spent in obtaining information about her disease, reviewing her previous labs, office visit notes, hospitalization records, and DM treatments, counseling her about her chronic conditions (please see the discussed topics above), and developing a plan to further investigate these. We also discussed about working towards improving diet. She had a number of questions which I addressed.   Carlus Pavlov, MD PhD [facility] Endocrinology

## 2023-02-16 LAB — T4, FREE: Free T4: 0.9 ng/dL (ref 0.8–1.8)

## 2023-02-16 LAB — TSH: TSH: 0.51 m[IU]/L

## 2023-02-16 LAB — T3, FREE: T3, Free: 3.1 pg/mL (ref 2.3–4.2)

## 2023-02-16 LAB — VITAMIN D 25 HYDROXY (VIT D DEFICIENCY, FRACTURES): Vit D, 25-Hydroxy: 27 ng/mL — ABNORMAL LOW (ref 30–100)

## 2023-02-20 ENCOUNTER — Encounter: Payer: Self-pay | Admitting: Internal Medicine

## 2023-02-21 MED ORDER — VITAMIN D3 125 MCG (5000 UT) PO CAPS: 5000.0000 [IU] | ORAL_CAPSULE | Freq: Every day | ORAL | 2 refills | Status: DC

## 2023-03-21 ENCOUNTER — Encounter: Payer: Self-pay | Admitting: Nurse Practitioner

## 2023-04-05 ENCOUNTER — Other Ambulatory Visit (HOSPITAL_COMMUNITY)
Admission: RE | Admit: 2023-04-05 | Discharge: 2023-04-05 | Disposition: A | Discharge status: Home / Self Care | Payer: Commercial Managed Care - PPO | Source: Ambulatory Visit | Attending: Obstetrics & Gynecology | Admitting: Obstetrics & Gynecology

## 2023-04-05 ENCOUNTER — Ambulatory Visit: Payer: Commercial Managed Care - PPO | Admitting: Obstetrics & Gynecology

## 2023-04-05 ENCOUNTER — Other Ambulatory Visit (HOSPITAL_COMMUNITY): Payer: Self-pay

## 2023-04-05 ENCOUNTER — Encounter: Payer: Self-pay | Admitting: Obstetrics & Gynecology

## 2023-04-05 ENCOUNTER — Other Ambulatory Visit: Payer: Self-pay

## 2023-04-05 VITALS — BP 138/81 | HR 73

## 2023-04-05 DIAGNOSIS — N898 Other specified noninflammatory disorders of vagina: Secondary | ICD-10-CM | POA: Diagnosis not present

## 2023-04-05 DIAGNOSIS — Z1331 Encounter for screening for depression: Secondary | ICD-10-CM

## 2023-04-05 DIAGNOSIS — Z01419 Encounter for gynecological examination (general) (routine) without abnormal findings: Secondary | ICD-10-CM | POA: Insufficient documentation

## 2023-04-05 DIAGNOSIS — N951 Menopausal and female climacteric states: Secondary | ICD-10-CM | POA: Diagnosis not present

## 2023-04-05 LAB — CERVICOVAGINAL ANCILLARY ONLY
Bacterial Vaginitis (gardnerella): NEGATIVE
Candida Glabrata: NEGATIVE
Candida Vaginitis: NEGATIVE
Comment: NEGATIVE
Comment: NEGATIVE
Comment: NEGATIVE
Comment: NEGATIVE
Trichomonas: NEGATIVE

## 2023-04-05 MED ORDER — ESTRADIOL 0.05 MG/24HR TD PTWK
0.0500 mg | MEDICATED_PATCH | TRANSDERMAL | 12 refills | Status: DC
  Filled 2023-04-05: qty 4, 28d supply, fill #0

## 2023-04-05 MED ORDER — MEDROXYPROGESTERONE ACETATE 2.5 MG PO TABS
2.5000 mg | ORAL_TABLET | Freq: Every day | ORAL | 2 refills | Status: DC
  Filled 2023-04-05: qty 30, 30d supply, fill #0

## 2023-04-05 NOTE — Patient Instructions (Signed)
 Marland Kitchen

## 2023-04-05 NOTE — Progress Notes (Signed)
GYNECOLOGY ANNUAL PREVENTATIVE CARE ENCOUNTER NOTE  History:     Kathy Merritt is a 53 y.o. [id number] female here for a routine annual gynecologic exam and to establish care.  Current complaints: vaginal odor for a few days, desires evaluation. Has not been sexually active for years.  Also was seen by her endocrinologist recently and reported having concerning hot flashes, hair loss, weight changes. Had negative endocrinology evaluation, the recommendation was to discuss being on HRT for her symptoms.   Denies abnormal vaginal bleeding, discharge, pelvic pain or other gynecologic concerns.    Gynecologic History Patient's last menstrual period was 07/18/2013. Contraception: post menopausal status since 2020 Last Pap: A few years ago. Result was normal with negative HPV Last Mammogram: 06/15/2022.  Result was normal Last Colonoscopy: 10/30/2020.  Result was normal  Obstetric History OB History  Gravida Para Term Preterm AB Living  2 2 2   2   SAB IAB Ectopic Multiple Live Births      2    # Outcome Date GA Lbr Len/2nd Weight Sex Type Anes PTL Lv  2 Term 06/24/99     CS-LTranv   LIV  1 Term 09/23/93    [facility]    Past Medical History:  Diagnosis Date   Allergy    Anemia    Blood transfusion without reported diagnosis 2001   Elevated hemoglobin A1c    Fatigue    Pre-diabetes    Vitamin D deficiency     Past Surgical History:  Procedure Laterality Date   CESAREAN SECTION  1995, 2001   x 2   TOOTH EXTRACTION     WISDOM TOOTH EXTRACTION      Current Outpatient Medications on File Prior to Visit  Medication Sig Dispense Refill   ascorbic acid (VITAMIN C) 250 MG CHEW Chew 250 mg by mouth daily.     cholecalciferol (VITAMIN D) 25 MCG (1000 UNIT) tablet Take 1,000 Units by mouth daily. Takes 2-3 gummies every other day     Cholecalciferol (VITAMIN D3) 125 MCG (5000 UT) CAPS Take 1 capsule (5,000 Units total) by mouth daily. 30 capsule 2   MAGNESIUM GLYCINATE  PO Take by mouth.     Multiple Vitamins-Minerals (MULTIVITAMIN WITH MINERALS) tablet Take 1 tablet by mouth daily. LIQUID     Current Facility-Administered Medications on File Prior to Visit  Medication Dose Route Frequency Provider Last Rate Last Admin   0.9 %  sodium chloride infusion  500 mL Intravenous Once Mansouraty, Netty Starring., MD        Allergies  Allergen Reactions   Pine Shortness Of Breath    wheezing   Prednisone Other (See Comments)    Very severe constipation   Latex Itching and Rash   Other Rash    Pionsetta plants    Social History:  reports that she has never smoked. She has never used smokeless tobacco. She reports that she does not currently use alcohol. She reports that she does not use drugs.  Family History  Problem Relation Age of Onset   Healthy Mother    Diabetes Father    Hypertension Father    Cancer Father        liver and gallbladder   Dementia Maternal Grandmother    Colon cancer Neg Hx    Colon polyps Neg Hx    Esophageal cancer Neg Hx    Rectal cancer Neg Hx    Stomach cancer Neg Hx     The  following portions of the patient's history were reviewed and updated as appropriate: allergies, current medications, past family history, past medical history, past social history, past surgical history and problem list.  Review of Systems Pertinent items noted in HPI and remainder of comprehensive ROS otherwise negative.  Physical Exam:  BP 138/81   Pulse 73   LMP 07/18/2013  CONSTITUTIONAL: Well-developed, well-nourished female in no acute distress.  HENT:  Normocephalic, atraumatic, External right and left ear normal.  EYES: Conjunctivae and EOM are normal. Pupils are equal, round, and reactive to light. No scleral icterus.  NECK: Normal range of motion, supple, no masses.  Normal thyroid.  SKIN: Skin is warm and dry. No rash noted. Not diaphoretic. No erythema. No pallor. MUSCULOSKELETAL: Normal range of motion. No tenderness.  No cyanosis,  clubbing, or edema. NEUROLOGIC: Alert and oriented to person, place, and time. Normal reflexes, muscle tone coordination.  PSYCHIATRIC: Normal mood and affect. Normal behavior. Normal judgment and thought content. CARDIOVASCULAR: Normal heart rate noted, regular rhythm RESPIRATORY: Clear to auscultation bilaterally. Effort and breath sounds normal, no problems with respiration noted. BREASTS: Deferred by patient. ABDOMEN: Soft, no distention noted.  No tenderness, rebound or guarding.  PELVIC: Normal appearing external genitalia and urethral meatus; normal appearing vaginal mucosa and cervix with mild diffuse atrophy.  Scant clear vaginal discharge noted, no odor noted, testing sample obtained.  Pap smear obtained.  Normal uterine size, no other palpable masses, no uterine or adnexal tenderness.  Performed in the presence of a chaperone.   Assessment and Plan:     1. Vaginal odor - Cervicovaginal ancillary only done, will follow up results and manage accordingly. Proper vulvar hygiene emphasized: discussed avoidance of perfumed soaps, detergents, lotions and any type of douches; in addition to wearing cotton underwear and no underwear at night.  Also recommended cleaning front to back, voiding.   2. Vasomotor symptoms due to menopause Patient with bothersome menopausal vasomotor symptoms. Discussed lifestyle interventions such as wearing light clothing, remaining in cool environments, having fan/air conditioner in the room, avoiding hot beverages etc.  Discussed using hormone therapy and concerns about increased risk of heart disease, cerebrovascular disease, thromboembolic disease,  and breast cancer.  Also discussed other medical options such as Paxil, Effexor, Brisdelle or Neurontin.  There is also a new nonhormonal medication called Veozah, which is FDA-approved for menopausal vasomotor symptoms but there has been concern about elevated LFTs with this medication so it was not recommended at this  time.   Also discussed alternative therapies such as herbal remedies but cautioned that most of the products contained phytoestrogens (plant estrogens) in unregulated amounts which can have the same effects on the body as the pharmaceutical estrogen preparations.   Patient opted for combined estrogen/progestin hormone therapy for now. Recommended transdermal estrogen and progestin, the combination patch was not on insurance formulary but the estrogen patch was.  Gave option about cyclic vs continuous progestin, she wanted continuous progestin.  Climara and Provera prescribed.  She will return in 2 months for reevaluation. Of note, it there is an issue with insurance coverage or cost, can consider oral Prempro as an alternative. - estradiol (CLIMARA) 0.05 mg/24hr patch; Place 1 patch (0.05 mg total) onto the skin once a week.  Dispense: 4 patch; Refill: 12 - medroxyPROGESTERone (PROVERA) 2.5 MG tablet; Take 1 tablet (2.5 mg total) by mouth daily.  Dispense: 30 tablet; Refill: 2  3. Well woman exam with routine gynecological exam (Primary) - Cytology - PAP Will follow up  results of pap smear and manage accordingly. Mammogram and colon cancer screening up to date. Routine preventative health maintenance measures emphasized. Please refer to After Visit Summary for other counseling recommendations.      Jaynie Collins, MD, FACOG Obstetrician & Gynecologist, [facility] for [employer], [facility]

## 2023-04-06 ENCOUNTER — Encounter: Payer: Self-pay | Admitting: Obstetrics & Gynecology

## 2023-04-10 LAB — CYTOLOGY - PAP
Comment: NEGATIVE
Diagnosis: NEGATIVE
High risk HPV: NEGATIVE

## 2023-04-12 ENCOUNTER — Encounter: Payer: Self-pay | Admitting: Obstetrics & Gynecology

## 2023-04-14 ENCOUNTER — Other Ambulatory Visit (HOSPITAL_COMMUNITY): Payer: Self-pay

## 2023-05-31 ENCOUNTER — Ambulatory Visit: Payer: Commercial Managed Care - PPO | Admitting: Obstetrics & Gynecology

## 2023-05-31 ENCOUNTER — Ambulatory Visit: Payer: Commercial Managed Care - PPO | Admitting: Nurse Practitioner

## 2023-06-30 ENCOUNTER — Encounter: Payer: Self-pay | Admitting: Nurse Practitioner

## 2023-07-09 ENCOUNTER — Encounter (HOSPITAL_BASED_OUTPATIENT_CLINIC_OR_DEPARTMENT_OTHER): Payer: Self-pay

## 2023-07-09 ENCOUNTER — Inpatient Hospital Stay (HOSPITAL_BASED_OUTPATIENT_CLINIC_OR_DEPARTMENT_OTHER)
Admission: EM | Admit: 2023-07-09 | Discharge: 2023-07-14 | DRG: 558 | Disposition: A | Discharge status: Home / Self Care | Attending: Family Medicine | Admitting: Family Medicine

## 2023-07-09 DIAGNOSIS — M6282 Rhabdomyolysis: Secondary | ICD-10-CM | POA: Diagnosis not present

## 2023-07-09 DIAGNOSIS — R7401 Elevation of levels of liver transaminase levels: Secondary | ICD-10-CM | POA: Insufficient documentation

## 2023-07-09 DIAGNOSIS — D649 Anemia, unspecified: Secondary | ICD-10-CM | POA: Diagnosis present

## 2023-07-09 DIAGNOSIS — R7303 Prediabetes: Secondary | ICD-10-CM | POA: Diagnosis present

## 2023-07-09 DIAGNOSIS — R7989 Other specified abnormal findings of blood chemistry: Secondary | ICD-10-CM

## 2023-07-09 MED ORDER — HYDROMORPHONE HCL 1 MG/ML IJ SOLN
1.0000 mg | Freq: Once | INTRAMUSCULAR | Status: AC
  Administered 2023-07-09: 1 mg via INTRAMUSCULAR
  Filled 2023-07-09: qty 1

## 2023-07-09 NOTE — ED Notes (Addendum)
 Went in to stick patient for labs.She is not able to tolerate the tourniquet on her arms due to pain/swelling in her arms. Spoke with MD to see if we could medicate pt. for pain prior to IV stick.

## 2023-07-09 NOTE — ED Provider Notes (Signed)
 [facility] Emergency Department Provider Note MRN:  [id number]  Arrival date & time: 07/10/23     Chief Complaint   Arm Pain   History of Present Illness   Kathy Merritt is a 53 y.o. year-old female with no pertinent past medical history presenting to the ED with chief complaint of arm pain.  Pain to bilateral arms for the past 3 to 4 days.  Did a weight training exercise with a friend 3 to 4 days ago with a lot of arm flexion.  Now is very sore and swollen in bilateral arms, cannot straighten the arms without pain.  Arms feel swollen.  Mild soreness to the bilateral thighs as well.  Denies urinary symptoms though urine has looked a bit dark recently.  Review of Systems  A thorough review of systems was obtained and all systems are negative except as noted in the HPI and PMH.   Patient's Health History    Past Medical History:  Diagnosis Date   Allergy    Anemia    Blood transfusion without reported diagnosis 2001   Elevated hemoglobin A1c    Fatigue    Pre-diabetes    Vitamin D  deficiency     Past Surgical History:  Procedure Laterality Date   CESAREAN SECTION  1995, 2001   x 2   TOOTH EXTRACTION     WISDOM TOOTH EXTRACTION      Family History  Problem Relation Age of Onset   Healthy Mother    Diabetes Father    Hypertension Father    Cancer Father        liver and gallbladder   Dementia Maternal Grandmother    Colon cancer Neg Hx    Colon polyps Neg Hx    Esophageal cancer Neg Hx    Rectal cancer Neg Hx    Stomach cancer Neg Hx     Social History   Socioeconomic History   Marital status: Single    Spouse name: Not on file   Number of children: 2   Years of education: Not on file   Highest education level: Not on file  Occupational History   Occupation: Clinic front office rep  Tobacco Use   Smoking status: Never   Smokeless tobacco: Never  Vaping Use   Vaping status: Never Used  Substance and Sexual Activity    Alcohol use: Not Currently   Drug use: No   Sexual activity: Not Currently    Birth control/protection: Abstinence  Other Topics Concern   Not on file  Social History Narrative   Not on file   Social Drivers of Health   Financial Resource Strain: Medium Risk (11/30/2022)   Overall Financial Resource Strain (CARDIA)    Difficulty of Paying Living Expenses: Somewhat hard  Food Insecurity: No Food Insecurity (11/30/2022)   Hunger Vital Sign    Worried About Running Out of Food in the Last Year: Never true    Ran Out of Food in the Last Year: Never true  Transportation Needs: No Transportation Needs (11/30/2022)   PRAPARE - Administrator, Civil Service (Medical): No    Lack of Transportation (Non-Medical): No  Physical Activity: Sufficiently Active (11/30/2022)   Exercise Vital Sign    Days of Exercise per Week: 6 days    Minutes of Exercise per Session: 40 min  Stress: No Stress Concern Present (11/30/2022)   [employer] of Occupational Health - Occupational Stress Questionnaire    Feeling of Stress :  Only a little  Social Connections: Moderately Integrated (11/30/2022)   Social Connection and Isolation Panel [NHANES]    Frequency of Communication with Friends and Family: More than three times a week    Frequency of Social Gatherings with Friends and Family: More than three times a week    Attends Religious Services: More than 4 times per year    Active Member of Clubs or Organizations: Yes    Attends Engineer, structural: More than 4 times per year    Marital Status: Divorced  Intimate Partner Violence: Not on file     Physical Exam   Vitals:   07/10/23 0015 07/10/23 0535  BP: (!) 173/111 (!) 135/94  Pulse: (!) 59 67  Resp: 15 15  Temp:    SpO2: 100% 100%    CONSTITUTIONAL: Well-appearing, NAD NEURO/PSYCH:  Alert and oriented x 3, no focal deficits EYES:  eyes equal and reactive ENT/NECK:  no LAD, no JVD CARDIO: Regular rate, well-perfused,  normal S1 and S2 PULM:  CTAB no wheezing or rhonchi GI/GU:  non-distended, non-tender MSK/SPINE:  No gross deformities, no edema SKIN:  no rash, atraumatic   *Additional and/or pertinent findings included in MDM below  Diagnostic and Interventional Summary    EKG Interpretation Date/Time:    Ventricular Rate:    PR Interval:    QRS Duration:    QT Interval:    QTC Calculation:   R Axis:      Text Interpretation:         Labs Reviewed  URINALYSIS, ROUTINE W REFLEX MICROSCOPIC - Abnormal; Notable for the following components:      Result Value   Hgb urine dipstick LARGE (*)    Protein, ur 30 (*)    Bacteria, UA MANY (*)    All other components within normal limits  CK - Abnormal; Notable for the following components:   Total CK >20,000 (*)    All other components within normal limits  COMPREHENSIVE METABOLIC PANEL WITH GFR - Abnormal; Notable for the following components:   Glucose, Bld 101 (*)    Calcium 8.5 (*)    Total Protein 6.0 (*)    Albumin 3.0 (*)    AST 775 (*)    ALT 175 (*)    All other components within normal limits  CBC  PROTIME-INR  HEPATITIS PANEL, ACUTE    No orders to display    Medications  lactated ringers infusion ( Intravenous New Bag/Given 07/10/23 0631)  HYDROmorphone (DILAUDID) injection 1 mg (1 mg Intramuscular Given 07/09/23 2348)  HYDROmorphone (DILAUDID) injection 1 mg (1 mg Intravenous Given 07/10/23 2841)     Procedures  /  Critical Care .Critical Care  Performed by: Edson Graces, MD Authorized by: Edson Graces, MD   Critical care provider statement:    Critical care time (minutes):  45   Critical care was necessary to treat or prevent imminent or life-threatening deterioration of the following conditions: rhabdomyolysis and acute liver injury.   Critical care was time spent personally by me on the following activities:  Development of treatment plan with patient or surrogate, discussions with consultants, evaluation of  patient's response to treatment, examination of patient, ordering and review of laboratory studies, ordering and review of radiographic studies, ordering and performing treatments and interventions, pulse oximetry, re-evaluation of patient's condition and review of old charts   ED Course and Medical Decision Making  Initial Impression and Ddx Suspect patient has specific soreness to the biceps tendons  bilaterally.  Arms feel a bit tight and swollen bilaterally but neurovascularly intact.  Rhabdomyolysis is also considered awaiting laboratory assessment.  Past medical/surgical history that increases complexity of ED encounter: None  Interpretation of Diagnostics I personally reviewed the Laboratory Testing and my interpretation is as follows: Labs delayed due to machine malfunction, blood had to be redrawn and sent to neighboring emergency department.  Labs reveal markedly elevated CK as well as liver function testing.  Normal renal function.    Patient Reassessment and Ultimate Disposition/Management     Patient doing well on reassessment, continues to have pain, tender arms that are firm and swollen but neurovascularly intact.  Plan is for hospitalist admission, patient receiving IV fluids.  Patient management required discussion with the following services or consulting groups:  Hospitalist Service  Complexity of Problems Addressed Acute illness or injury that poses threat of life of bodily function  Additional Data Reviewed and Analyzed Further history obtained from: Further history from spouse/family member  Additional Factors Impacting ED Encounter Risk Consideration of hospitalization  Merrick Abe. Harless Lien, MD [facility] [email or web address]  Final Clinical Impressions(s) / ED Diagnoses     ICD-10-CM   1. Non-traumatic rhabdomyolysis  M62.82     2. LFT elevation  R79.89       ED Discharge Orders     None         Discharge Instructions Discussed with and Provided to Patient:   Discharge Instructions   None      Edson Graces, MD 07/10/23 [phone]

## 2023-07-09 NOTE — ED Triage Notes (Signed)
 Pt states that she worked out for the first time in a while on Friday. Pt reports pain and swelling in bilateral arms that started Saturday and has progressively worsened. Pain is from bilateral  lower forearms to upper arms.

## 2023-07-10 ENCOUNTER — Telehealth

## 2023-07-10 ENCOUNTER — Other Ambulatory Visit: Payer: Self-pay

## 2023-07-10 DIAGNOSIS — R7401 Elevation of levels of liver transaminase levels: Secondary | ICD-10-CM | POA: Diagnosis not present

## 2023-07-10 DIAGNOSIS — R7303 Prediabetes: Secondary | ICD-10-CM | POA: Diagnosis not present

## 2023-07-10 DIAGNOSIS — M6282 Rhabdomyolysis: Secondary | ICD-10-CM | POA: Diagnosis not present

## 2023-07-10 DIAGNOSIS — D649 Anemia, unspecified: Secondary | ICD-10-CM | POA: Diagnosis not present

## 2023-07-10 DIAGNOSIS — T796XXA Traumatic ischemia of muscle, initial encounter: Secondary | ICD-10-CM

## 2023-07-10 LAB — COMPREHENSIVE METABOLIC PANEL WITH GFR
ALT: 175 U/L — ABNORMAL HIGH (ref 0–44)
AST: 775 U/L — ABNORMAL HIGH (ref 15–41)
Albumin: 3 g/dL — ABNORMAL LOW (ref 3.5–5.0)
Alkaline Phosphatase: 46 U/L (ref 38–126)
Anion gap: 10 (ref 5–15)
BUN: 10 mg/dL (ref 6–20)
CO2: 25 mmol/L (ref 22–32)
Calcium: 8.5 mg/dL — ABNORMAL LOW (ref 8.9–10.3)
Chloride: 103 mmol/L (ref 98–111)
Creatinine, Ser: 0.66 mg/dL (ref 0.44–1.00)
GFR, Estimated: >60 mL/min (ref >60)
Glucose, Bld: 101 mg/dL — ABNORMAL HIGH (ref 70–99)
Potassium: 4.2 mmol/L (ref 3.5–5.1)
Sodium: 138 mmol/L (ref 135–145)
Total Bilirubin: 0.6 mg/dL (ref 0.0–1.2)
Total Protein: 6 g/dL — ABNORMAL LOW (ref 6.5–8.1)

## 2023-07-10 LAB — CBC
HCT: 37.5 % (ref 36.0–46.0)
Hemoglobin: 12 g/dL (ref 12.0–15.0)
MCH: 27.4 pg (ref 26.0–34.0)
MCHC: 32 g/dL (ref 30.0–36.0)
MCV: 85.6 fL (ref 80.0–100.0)
Platelets: 312 10*3/uL (ref 150–400)
RBC: 4.38 MIL/uL (ref 3.87–5.11)
RDW: 14.6 % (ref 11.5–15.5)
WBC: 7.2 10*3/uL (ref 4.0–10.5)
nRBC: 0 % (ref 0.0–0.2)

## 2023-07-10 LAB — URINALYSIS, ROUTINE W REFLEX MICROSCOPIC
Bilirubin Urine: NEGATIVE
Glucose, UA: NEGATIVE mg/dL
Ketones, ur: NEGATIVE mg/dL
Leukocytes,Ua: NEGATIVE
Nitrite: NEGATIVE
Protein, ur: 30 mg/dL — AB
Specific Gravity, Urine: 1.017 (ref 1.005–1.030)
pH: 7 (ref 5.0–8.0)

## 2023-07-10 LAB — PROTIME-INR
INR: 1 (ref 0.8–1.2)
Prothrombin Time: 13.6 s (ref 11.4–15.2)

## 2023-07-10 LAB — HEPATITIS PANEL, ACUTE
HCV Ab: NONREACTIVE
Hep A IgM: NONREACTIVE
Hep B C IgM: NONREACTIVE
Hepatitis B Surface Ag: NONREACTIVE

## 2023-07-10 LAB — CK: Total CK: >20000 U/L — ABNORMAL HIGH (ref 38–234)

## 2023-07-10 MED ORDER — ONDANSETRON HCL 4 MG PO TABS: 4.0000 mg | ORAL_TABLET | Freq: Four times a day (QID) | ORAL | Status: DC | PRN

## 2023-07-10 MED ORDER — HYDROMORPHONE HCL 1 MG/ML IJ SOLN: 0.5000 mg | INTRAMUSCULAR | Status: DC | PRN

## 2023-07-10 MED ORDER — HYDROMORPHONE HCL 1 MG/ML IJ SOLN
1.0000 mg | Freq: Once | INTRAMUSCULAR | Status: DC
  Filled 2023-07-10: qty 1

## 2023-07-10 MED ORDER — IBUPROFEN 200 MG PO TABS: 400.0000 mg | ORAL_TABLET | Freq: Four times a day (QID) | ORAL | Status: DC | PRN

## 2023-07-10 MED ORDER — LACTATED RINGERS IV SOLN: INTRAVENOUS | Status: DC

## 2023-07-10 MED ORDER — ALBUTEROL SULFATE (2.5 MG/3ML) 0.083% IN NEBU: 2.5000 mg | INHALATION_SOLUTION | RESPIRATORY_TRACT | Status: DC | PRN

## 2023-07-10 MED ORDER — ONDANSETRON HCL 4 MG/2ML IJ SOLN
4.0000 mg | Freq: Four times a day (QID) | INTRAMUSCULAR | Status: DC | PRN
Start: 2023-07-10 — End: 2023-07-14

## 2023-07-10 MED ORDER — HYDROMORPHONE HCL 1 MG/ML IJ SOLN
1.0000 mg | Freq: Once | INTRAMUSCULAR | Status: AC
  Administered 2023-07-10: 1 mg via INTRAVENOUS

## 2023-07-10 MED ORDER — OXYCODONE HCL 5 MG PO TABS: 5.0000 mg | ORAL_TABLET | ORAL | Status: DC | PRN

## 2023-07-10 NOTE — Plan of Care (Signed)

## 2023-07-10 NOTE — ED Notes (Signed)
 Patient visitor given a recliner, warm blankets and pillow for comfort.

## 2023-07-10 NOTE — H&P (Signed)
 History and Physical  Kathy Merritt ZOX:[id number] DOB: 15-Aug-1970 DOA: 07/09/2023  PCP: Susanna Epley, FNP   Chief Complaint: Bilateral arm pain  HPI: Kathy Merritt is a 53 y.o. female with medical history significant for prediabetes being admitted to the hospital with traumatic rhabdomyolysis.  Patient does not take any prescription medications.  She worked out with her sister and her 3 days ago.  Felt a little bit nauseous during a difficult workout, but felt normal later, with normal soreness in the ensuing 24 hours.  However about 48 hours after the workout, she started getting severe bilateral biceps and elbow soreness.  Noticed bilateral arm swelling.  She made an appointment with urgent care for this morning, however overnight her pain got worse so she came to the ER for evaluation.  She denies any fevers, chills, vomiting, numbness or tingling in her hands.    Review of Systems: Please see HPI for pertinent positives and negatives. A complete 10 system review of systems are otherwise negative.  Past Medical History:  Diagnosis Date   Allergy    Anemia    Blood transfusion without reported diagnosis 2001   Elevated hemoglobin A1c    Fatigue    Pre-diabetes    Vitamin D  deficiency    Past Surgical History:  Procedure Laterality Date   CESAREAN SECTION  1995, 2001   x 2   TOOTH EXTRACTION     WISDOM TOOTH EXTRACTION     Social History:  reports that she has never smoked. She has never used smokeless tobacco. She reports that she does not currently use alcohol. She reports that she does not use drugs.  Allergies  Allergen Reactions   Pine Shortness Of Breath    wheezing   Prednisone  Other (See Comments)    Very severe constipation   Latex Itching and Rash   Other Rash    P[place]etta plants    Family History  Problem Relation Age of Onset   Healthy Mother    Diabetes Father    Hypertension Father    Cancer Father        liver and gallbladder    Dementia Maternal Grandmother    Colon cancer Neg Hx    Colon polyps Neg Hx    Esophageal cancer Neg Hx    Rectal cancer Neg Hx    Stomach cancer Neg Hx      Prior to Admission medications   Medication Sig Start Date End Date Taking? Authorizing Provider  ascorbic acid (VITAMIN C) 250 MG CHEW Chew 250 mg by mouth daily.    [provider]  cholecalciferol (VITAMIN D ) 25 MCG (1000 UNIT) tablet Take 1,000 Units by mouth daily. Takes 2-3 gummies every other day    [provider]  Cholecalciferol (VITAMIN D3) 125 MCG (5000 UT) CAPS Take 1 capsule (5,000 Units total) by mouth daily. 02/21/23   Emilie Harden, MD  estradiol  (CLIMARA ) 0.05 mg/24hr patch Place 1 patch (0.05 mg total) onto the skin once a week. 04/05/23   Anyanwu, Ugonna A, MD  MAGNESIUM GLYCINATE PO Take by mouth.    [provider]  medroxyPROGESTERone  (PROVERA ) 2.5 MG tablet Take 1 tablet (2.5 mg total) by mouth daily. 04/05/23   Anyanwu, Ugonna A, MD  Multiple Vitamins-Minerals (MULTIVITAMIN WITH MINERALS) tablet Take 1 tablet by mouth daily. LIQUID    [provider]    Physical Exam: BP 138/82   Pulse 60   Temp 98.4 F (36.9 C) (Oral)   Resp 18  LMP 07/18/2013   SpO2 100%  General:  Alert, oriented, calm, in no acute distress  Eyes: EOMI, clear conjuctivae, white sclerea Neck: supple, no masses, trachea mildline  Cardiovascular: RRR, no murmurs or rubs, no peripheral edema  Respiratory: clear to auscultation bilaterally, no wheezes, no crackles  Abdomen: soft, nontender, nondistended, normal bowel tones heard  Skin: dry, no rashes  Musculoskeletal: no joint effusions, normal range of motion but slightly limited by pain, she has obvious swelling about her biceps and elbow region bilaterally, upper extremities are neurovascularly intact Psychiatric: appropriate affect, normal speech  Neurologic: extraocular muscles intact, clear speech, moving all extremities with intact sensorium          Labs on Admission:  Basic Metabolic Panel: Recent Labs  Lab 07/10/23 0240  NA 138  K 4.2  CL 103  CO2 25  GLUCOSE 101*  BUN 10  CREATININE 0.66  CALCIUM 8.5*   Liver Function Tests: Recent Labs  Lab 07/10/23 0240  AST 775*  ALT 175*  ALKPHOS 46  BILITOT 0.6  PROT 6.0*  ALBUMIN 3.0*   No results for input(s): "LIPASE", "AMYLASE" in the last 168 hours. No results for input(s): "AMMONIA" in the last 168 hours. CBC: Recent Labs  Lab 07/09/23 2311  WBC 7.2  HGB 12.0  HCT 37.5  MCV 85.6  PLT 312   Cardiac Enzymes: Recent Labs  Lab 07/10/23 0120  CKTOTAL >20,000*   BNP (last 3 results) No results for input(s): "BNP" in the last 8760 hours.  ProBNP (last 3 results) No results for input(s): "PROBNP" in the last 8760 hours.  CBG: No results for input(s): "GLUCAP" in the last 168 hours.  Radiological Exams on Admission: No results found. Assessment/Plan Kathy Merritt is a 53 y.o. female with medical history significant for prediabetes being admitted to the hospital with traumatic rhabdomyolysis.  Rhabdomyolysis-traumatic after intense workout, with CK greater than 20,000, normal kidney function. -Observation admission -IV fluids -Pain medication as needed -Trend CK  Abnormal LFTs-likely due to rhabdomyolysis, will trend with daily labs.  If not improving in the morning would recommend further workup  DVT prophylaxis: Low risk, patient is ambulatory    Code Status: Full Code  Consults called: None  Admission status: Observation  Time spent: 46 minutes  Cherree Conerly Rickey Charm MD [facility] Pager [phone]  If 7PM-7AM, please contact night-coverage [email or web address] Password [email or web address]  07/10/2023, 11:01 AM

## 2023-07-10 NOTE — ED Notes (Signed)
 Per lab courier picked up samples 10 minutes ago. Called lab at [facility] to make them aware the samples were on the way.

## 2023-07-10 NOTE — ED Notes (Signed)
[place] with  cl called for transport 

## 2023-07-10 NOTE — ED Notes (Signed)
 Labs recollected. Courier on the way to pick up labs and send them to [facility] for processing. Patient was made aware of the delays in lab processing. MD is also aware.

## 2023-07-11 DIAGNOSIS — R7401 Elevation of levels of liver transaminase levels: Secondary | ICD-10-CM | POA: Insufficient documentation

## 2023-07-11 DIAGNOSIS — M6282 Rhabdomyolysis: Secondary | ICD-10-CM | POA: Diagnosis not present

## 2023-07-11 DIAGNOSIS — R7303 Prediabetes: Secondary | ICD-10-CM | POA: Diagnosis not present

## 2023-07-11 DIAGNOSIS — D649 Anemia, unspecified: Secondary | ICD-10-CM | POA: Diagnosis not present

## 2023-07-11 LAB — BASIC METABOLIC PANEL WITH GFR
Anion gap: 8 (ref 5–15)
BUN: 13 mg/dL (ref 6–20)
CO2: 25 mmol/L (ref 22–32)
Calcium: 8.8 mg/dL — ABNORMAL LOW (ref 8.9–10.3)
Chloride: 109 mmol/L (ref 98–111)
Creatinine, Ser: 0.56 mg/dL (ref 0.44–1.00)
GFR, Estimated: >60 mL/min (ref >60)
Glucose, Bld: 92 mg/dL (ref 70–99)
Potassium: 4.2 mmol/L (ref 3.5–5.1)
Sodium: 142 mmol/L (ref 135–145)

## 2023-07-11 LAB — HEPATIC FUNCTION PANEL
ALT: 222 U/L — ABNORMAL HIGH (ref 0–44)
AST: 672 U/L — ABNORMAL HIGH (ref 15–41)
Albumin: 3.1 g/dL — ABNORMAL LOW (ref 3.5–5.0)
Alkaline Phosphatase: 47 U/L (ref 38–126)
Bilirubin, Direct: <0.1 mg/dL (ref 0.0–0.2)
Total Bilirubin: 0.8 mg/dL (ref 0.0–1.2)
Total Protein: 6.3 g/dL — ABNORMAL LOW (ref 6.5–8.1)

## 2023-07-11 LAB — CBC
HCT: 36.4 % (ref 36.0–46.0)
Hemoglobin: 10.7 g/dL — ABNORMAL LOW (ref 12.0–15.0)
MCH: 26.8 pg (ref 26.0–34.0)
MCHC: 29.4 g/dL — ABNORMAL LOW (ref 30.0–36.0)
MCV: 91 fL (ref 80.0–100.0)
Platelets: 302 10*3/uL (ref 150–400)
RBC: 4 MIL/uL (ref 3.87–5.11)
RDW: 14.6 % (ref 11.5–15.5)
WBC: 6.3 10*3/uL (ref 4.0–10.5)
nRBC: 0 % (ref 0.0–0.2)

## 2023-07-11 LAB — CK: Total CK: >50000 U/L — ABNORMAL HIGH (ref 38–234)

## 2023-07-11 LAB — HIV ANTIBODY (ROUTINE TESTING W REFLEX): HIV Screen 4th Generation wRfx: NONREACTIVE

## 2023-07-11 NOTE — TOC Initial Note (Signed)
 Transition of Care [facility]) - Initial/Assessment Note    Patient Details  Name: Kathy Merritt MRN: [id number] Date of Birth: February 14, 1971  Transition of Care [facility]) CM/SW Contact:    Kathy Parish, RN Phone Number: 07/11/2023, 2:43 PM  Clinical Narrative:                 Transition of Care [facility]) - Inpatient Brief Assessment   Patient Details  Name: Kathy Merritt MRN: [id number] Date of Birth: Feb 06, 1971  Transition of Care [facility]) CM/SW Contact:    Kathy Parish, RN Phone Number: 07/11/2023, 2:43 PM   Clinical Narrative: Patient from home; PTA lives in house with sister Kathy Merritt [phone]); PCP and insurance verified;No DME, HH, oxygen or SDOH needs. Pt's sister or mother will transport home when discharged via private vehicle. No TOC needs identified during visit. TOC signing off. If TOC needs develop, please enter consult.   Transition of Care Asessment: Insurance and Status: Insurance coverage has been reviewed Patient has primary care physician: Yes Home environment has been reviewed: House with sister Prior level of function:: Independent Prior/Current Home Services: No current home services Social Drivers of Health Review: SDOH reviewed no interventions necessary Readmission risk has been reviewed: Yes Transition of care needs: no transition of care needs at this time         Patient Goals and CMS Choice            Expected Discharge Plan and Services                                              Prior Living Arrangements/Services                       Activities of Daily Living   ADL Screening (condition at time of admission) Independently performs ADLs?: Yes (appropriate for developmental age) Is the patient deaf or have difficulty hearing?: No Does the patient have difficulty seeing, even when wearing glasses/contacts?: No Does the patient have difficulty concentrating, remembering, or making decisions?:  No  Permission Sought/Granted                  Emotional Assessment              Admission diagnosis:  Rhabdomyolysis [M62.82] LFT elevation [R79.89] Non-traumatic rhabdomyolysis [M62.82] Patient Active Problem List   Diagnosis Date Noted   Rhabdomyolysis 07/10/2023   Subclinical hyperthyroidism 02/15/2023   Vitamin D  insufficiency 02/15/2023   Abnormal glucose 12/11/2022   Sore throat 12/11/2022   Acute nonintractable headache 12/11/2022   COVID-19 vaccination declined 12/11/2022   Influenza vaccination declined 12/11/2022   Menopause 08/21/2022   Abnormal thyroid  function test 08/21/2022   Aortic stenosis 12/24/2021   Fatigue 12/24/2021   Normocytic anemia 09/15/2020   PCP:  Kathy Epley, FNP Pharmacy:   [id number] [place], [place] - [address] [place] [address] Phone: [phone] Fax: [phone]  Melodee Spruce [facility] - [facility] [address] [place] [address] Phone: [phone] Fax: [phone]     Social Drivers of Health (SDOH) Social History: SDOH Screenings   Food Insecurity: No Food Insecurity (07/10/2023)  Housing: Low Risk  (07/10/2023)  Transportation Needs: No Transportation Needs (07/10/2023)  Utilities: Not At Risk (07/10/2023)  Depression (PHQ2-9): Low Risk  (04/05/2023)  Financial Resource Strain: Medium Risk (11/30/2022)  Physical Activity: Sufficiently Active (11/30/2022)  Social Connections: Moderately Integrated (11/30/2022)  Stress: No Stress Concern Present (11/30/2022)  Tobacco Use: Low Risk  (07/09/2023)   SDOH Interventions:     Readmission Risk Interventions     No data to display

## 2023-07-11 NOTE — Plan of Care (Signed)

## 2023-07-11 NOTE — Hospital Course (Addendum)
 53 year old woman presented with bilateral bicep soreness and elbow soreness as well as bilateral arm swelling following intense workout.  Admitted for rhabdomyolysis.  Consultants None   Procedures/Events None

## 2023-07-11 NOTE — Progress Notes (Signed)
  Progress Note   Patient: Kathy Merritt JOA:[id number] DOB: 06/05/1970 DOA: 07/09/2023     0 DOS: the patient was seen and examined on 07/11/2023   Brief hospital course: 53 year old woman presented with bilateral bicep soreness and elbow soreness as well as bilateral arm swelling following intense workout.  Admitted for rhabdomyolysis.  Consultants None   Procedures/Events None   Assessment and Plan: Acute rhabdomyolysis, nontraumatic Elevated transaminases Secondary to high repetition workout for the first time in a long time No complicating features present.  Primary symptoms are in upper extremities. CK up now over 50,000.  Continue aggressive IV fluids.  Trend BMP, CK and electrolytes.    Subjective:  Has arm soreness and swelling Worked out for the first time in a while, did 100s of repetitions of multiple exercises  Physical Exam: Vitals:   07/10/23 1443 07/10/23 1828 07/10/23 2229 07/11/23 0441  BP: 123/75 137/83 125/82 128/81  Pulse: 66 76 70   Resp:  16 12 16   Temp: 98.1 F (36.7 C) 98.4 F (36.9 C) 97.8 F (36.6 C) 97.7 F (36.5 C)  TempSrc: Oral Oral  Oral  SpO2: 100% 100% 100% 100%   Physical Exam Vitals reviewed.  Constitutional:      General: She is not in acute distress.    Appearance: She is not ill-appearing or toxic-appearing.  Cardiovascular:     Rate and Rhythm: Normal rate and regular rhythm.     Heart sounds: No murmur heard. Pulmonary:     Effort: Pulmonary effort is normal. No respiratory distress.     Breath sounds: No wheezing, rhonchi or rales.  Musculoskeletal:     Comments: Swelling of both arms is noted particularly upper arms.  Mild limitation of range of movement.  Neurological:     Mental Status: She is alert.  Psychiatric:        Mood and Affect: Mood normal.        Behavior: Behavior normal.     Data Reviewed: BMP unremarkable AST and ALT elevated but somewhat improved today, secondary to  rhabdomyolysis Potassium within normal limits Total CK up to greater than 50,000  Family Communication: Sister at bedside  Disposition: Status is: Inpatient     Time spent: 20 minutes  Author: Jerline Moon, MD 07/11/2023 4:29 PM  For on call review [email or web address].

## 2023-07-12 ENCOUNTER — Ambulatory Visit: Admitting: Nurse Practitioner

## 2023-07-12 DIAGNOSIS — M6282 Rhabdomyolysis: Secondary | ICD-10-CM | POA: Diagnosis not present

## 2023-07-12 LAB — COMPREHENSIVE METABOLIC PANEL WITH GFR
ALT: 227 U/L — ABNORMAL HIGH (ref 0–44)
AST: 498 U/L — ABNORMAL HIGH (ref 15–41)
Albumin: 3.2 g/dL — ABNORMAL LOW (ref 3.5–5.0)
Alkaline Phosphatase: 45 U/L (ref 38–126)
Anion gap: 9 (ref 5–15)
BUN: 11 mg/dL (ref 6–20)
CO2: 23 mmol/L (ref 22–32)
Calcium: 8.8 mg/dL — ABNORMAL LOW (ref 8.9–10.3)
Chloride: 108 mmol/L (ref 98–111)
Creatinine, Ser: 0.61 mg/dL (ref 0.44–1.00)
GFR, Estimated: >60 mL/min (ref >60)
Glucose, Bld: 92 mg/dL (ref 70–99)
Potassium: 4.3 mmol/L (ref 3.5–5.1)
Sodium: 140 mmol/L (ref 135–145)
Total Bilirubin: 0.5 mg/dL (ref 0.0–1.2)
Total Protein: 6.5 g/dL (ref 6.5–8.1)

## 2023-07-12 LAB — PHOSPHORUS: Phosphorus: 4 mg/dL (ref 2.5–4.6)

## 2023-07-12 LAB — CK: Total CK: 45877 U/L — ABNORMAL HIGH (ref 38–234)

## 2023-07-12 LAB — MAGNESIUM: Magnesium: 2 mg/dL (ref 1.7–2.4)

## 2023-07-12 NOTE — Plan of Care (Signed)

## 2023-07-12 NOTE — Progress Notes (Signed)
 PROGRESS NOTE    Kathy Merritt  WUJ:[id number] DOB: 05-03-1970 DOA: 07/09/2023 PCP: Susanna Epley, FNP  Chief Complaint  Patient presents with   Arm Pain    Brief Narrative:   53 year old woman presented with bilateral bicep soreness and elbow soreness as well as bilateral arm swelling following intense workout. Admitted for rhabdomyolysis.   Assessment & Plan:   Principal Problem:   Rhabdomyolysis Active Problems:   Elevated transaminase level  Rhabdomyolysis Upper extremity swelling/pain after intense workup where she did hundreds of curls, battle ropes, planking CK peaked 4/29 at >50,000 - today ~45,000 UA at presentation with large Hb, 0-5 RBC's -> myoglobinuria Continue IVF.  Follow strict I/O, daily weights.  Would like her CK to be less than 10,000 prior to considering discharge.  Elevated LFT's Related to rhabdo above Follow with resolution of above Consider RUQ US  Follow acute hepatitis panel    DVT prophylaxis: she's walking laps - frequent ambulation Code Status: full Family Communication: none Disposition:   Status is: Inpatient Remains inpatient appropriate because: need for further improvement in CK   Consultants:  none  Procedures:  none  Antimicrobials:  Anti-infectives (From admission, onward)    None       Subjective: Arms feeling better  Objective: Vitals:   07/11/23 1159 07/11/23 2017 07/12/23 0349 07/12/23 1152  BP: (!) 152/105 114/73 126/73 (!) 156/96  Pulse: 73 86 68 73  Resp: 18 18 16 16   Temp: 98 F (36.7 C) 98.6 F (37 C) 98 F (36.7 C) 99.1 F (37.3 C)  TempSrc:    Oral  SpO2: 100% 99% 99% 100%    Intake/Output Summary (Last 24 hours) at 07/12/2023 1830 Last data filed at 07/12/2023 0418 Gross per 24 hour  Intake 1524.01 ml  Output --  Net 1524.01 ml   There were no vitals filed for this visit.  Examination:  General exam: Appears calm and comfortable  Respiratory system: unlabored Cardiovascular  system: RRR Central nervous system: Alert and oriented. No focal neurological deficits. Extremities: R>L upper extremity swelling, soft compartments (improved ROM compared to 4/29 per her report - able fully extend arm today)    Data Reviewed: I have personally reviewed following labs and imaging studies  CBC: Recent Labs  Lab 07/09/23 2311 07/11/23 0418  WBC 7.2 6.3  HGB 12.0 10.7*  HCT 37.5 36.4  MCV 85.6 91.0  PLT 312 302    Basic Metabolic Panel: Recent Labs  Lab 07/10/23 0240 07/11/23 0418 07/12/23 0445  NA 138 142 140  K 4.2 4.2 4.3  CL 103 109 108  CO2 25 25 23   GLUCOSE 101* 92 92  BUN 10 13 11   CREATININE 0.66 0.56 0.61  CALCIUM 8.5* 8.8* 8.8*  MG  --   --  2.0  PHOS  --   --  4.0    GFR: CrCl cannot be calculated (Unknown ideal weight.).  Liver Function Tests: Recent Labs  Lab 07/10/23 0240 07/11/23 0418 07/12/23 0445  AST 775* 672* 498*  ALT 175* 222* 227*  ALKPHOS 46 47 45  BILITOT 0.6 0.8 0.5  PROT 6.0* 6.3* 6.5  ALBUMIN 3.0* 3.1* 3.2*    CBG: No results for input(s): "GLUCAP" in the last 168 hours.   No results found for this or any previous visit (from the past 240 hours).       Radiology Studies: No results found.      Scheduled Meds: Continuous Infusions:  lactated ringers 150 mL/hr at 07/12/23 1732  LOS: 1 day    Time spent: over 30 min    Donnetta Gains, MD [facility]   To contact the attending provider between 7A-7P or the covering provider during after hours 7P-7A, please log into the web site [email or web address] and access using universal [place] password for that web site. If you do not have the password, please call the hospital operator.  07/12/2023, 6:30 PM

## 2023-07-13 ENCOUNTER — Encounter: Payer: Self-pay | Admitting: Nurse Practitioner

## 2023-07-13 DIAGNOSIS — M6282 Rhabdomyolysis: Secondary | ICD-10-CM | POA: Diagnosis not present

## 2023-07-13 LAB — CBC WITH DIFFERENTIAL/PLATELET
Abs Immature Granulocytes: 0.01 10*3/uL (ref 0.00–0.07)
Basophils Absolute: 0 10*3/uL (ref 0.0–0.1)
Basophils Relative: 1 %
Eosinophils Absolute: 0.2 10*3/uL (ref 0.0–0.5)
Eosinophils Relative: 4 %
HCT: 33.8 % — ABNORMAL LOW (ref 36.0–46.0)
Hemoglobin: 10.4 g/dL — ABNORMAL LOW (ref 12.0–15.0)
Immature Granulocytes: 0 %
Lymphocytes Relative: 44 %
Lymphs Abs: 2.5 10*3/uL (ref 0.7–4.0)
MCH: 27.3 pg (ref 26.0–34.0)
MCHC: 30.8 g/dL (ref 30.0–36.0)
MCV: 88.7 fL (ref 80.0–100.0)
Monocytes Absolute: 0.3 10*3/uL (ref 0.1–1.0)
Monocytes Relative: 6 %
Neutro Abs: 2.6 10*3/uL (ref 1.7–7.7)
Neutrophils Relative %: 45 %
Platelets: 311 10*3/uL (ref 150–400)
RBC: 3.81 MIL/uL — ABNORMAL LOW (ref 3.87–5.11)
RDW: 14.6 % (ref 11.5–15.5)
WBC: 5.7 10*3/uL (ref 4.0–10.5)
nRBC: 0 % (ref 0.0–0.2)

## 2023-07-13 LAB — COMPREHENSIVE METABOLIC PANEL WITH GFR
ALT: 211 U/L — ABNORMAL HIGH (ref 0–44)
AST: 314 U/L — ABNORMAL HIGH (ref 15–41)
Albumin: 3.2 g/dL — ABNORMAL LOW (ref 3.5–5.0)
Alkaline Phosphatase: 47 U/L (ref 38–126)
Anion gap: 8 (ref 5–15)
BUN: 9 mg/dL (ref 6–20)
CO2: 29 mmol/L (ref 22–32)
Calcium: 8.9 mg/dL (ref 8.9–10.3)
Chloride: 103 mmol/L (ref 98–111)
Creatinine, Ser: 0.6 mg/dL (ref 0.44–1.00)
GFR, Estimated: >60 mL/min (ref >60)
Glucose, Bld: 90 mg/dL (ref 70–99)
Potassium: 4.5 mmol/L (ref 3.5–5.1)
Sodium: 140 mmol/L (ref 135–145)
Total Bilirubin: 0.5 mg/dL (ref 0.0–1.2)
Total Protein: 6.5 g/dL (ref 6.5–8.1)

## 2023-07-13 LAB — PHOSPHORUS: Phosphorus: 4.4 mg/dL (ref 2.5–4.6)

## 2023-07-13 LAB — MAGNESIUM: Magnesium: 2.2 mg/dL (ref 1.7–2.4)

## 2023-07-13 LAB — CK: Total CK: 27290 U/L — ABNORMAL HIGH (ref 38–234)

## 2023-07-13 MED ORDER — POLYETHYLENE GLYCOL 3350 17 G PO PACK
17.0000 g | PACK | Freq: Every day | ORAL | Status: DC
  Administered 2023-07-13 – 2023-07-14 (×2): 17 g via ORAL
  Filled 2023-07-13 (×2): qty 1

## 2023-07-13 MED ORDER — DOCUSATE SODIUM 100 MG PO CAPS
100.0000 mg | ORAL_CAPSULE | Freq: Two times a day (BID) | ORAL | Status: DC
  Administered 2023-07-13 – 2023-07-14 (×3): 100 mg via ORAL
  Filled 2023-07-13 (×3): qty 1

## 2023-07-13 NOTE — Progress Notes (Signed)
 PROGRESS NOTE    Kathy Merritt  ZOX:[id number] DOB: 19-Jul-1970 DOA: 07/09/2023 PCP: Susanna Epley, FNP  Chief Complaint  Patient presents with   Arm Pain    Brief Narrative:   53 year old woman presented with bilateral bicep soreness and elbow soreness as well as bilateral arm swelling following intense workout. Admitted for rhabdomyolysis.   Assessment & Plan:   Principal Problem:   Rhabdomyolysis Active Problems:   Elevated transaminase level  Rhabdomyolysis Upper extremity swelling/pain after intense workup where she did hundreds of curls, battle ropes, planking CK peaked 4/29 at >50,000 - today ~27,000 UA at presentation with large Hb, 0-5 RBC's -> myoglobinuria Continue IVF.  Follow strict I/O, daily weights.  Would like her CK to be less than 10,000 prior to considering discharge.  Elevated LFT's Related to rhabdo above Follow with resolution of above Consider RUQ US  - can pursue outpatient if persistently elevated, currently improving Follow acute hepatitis panel    DVT prophylaxis: she's walking laps - frequent ambulation Code Status: full Family Communication: none Disposition:   Status is: Inpatient Remains inpatient appropriate because: need for further improvement in CK   Consultants:  none  Procedures:  none  Antimicrobials:  Anti-infectives (From admission, onward)    None       Subjective: Feels tired overall   Objective: Vitals:   07/12/23 2017 07/13/23 0617 07/13/23 0705 07/13/23 1141  BP: (!) 155/103 136/82  (!) 144/85  Pulse: 73 62  70  Resp: 18 17  16   Temp: 98 F (36.7 C) 98.6 F (37 C)  98.6 F (37 C)  TempSrc: Oral Oral  Oral  SpO2: 100% 100%  100%  Weight:   81.8 kg     Intake/Output Summary (Last 24 hours) at 07/13/2023 1527 Last data filed at 07/12/2023 1800 Gross per 24 hour  Intake 240 ml  Output --  Net 240 ml   Filed Weights   07/13/23 0705  Weight: 81.8 kg    Examination:  General: No acute  distress. Cardiovascular: RRR Lungs: unlabored Neurological: Alert and oriented 3. Moves all extremities 4 with equal strength. Cranial nerves II through XII grossly intact. Extremities: improving swelling, but still present     Data Reviewed: I have personally reviewed following labs and imaging studies  CBC: Recent Labs  Lab 07/09/23 2311 07/11/23 0418 07/13/23 0432  WBC 7.2 6.3 5.7  NEUTROABS  --   --  2.6  HGB 12.0 10.7* 10.4*  HCT 37.5 36.4 33.8*  MCV 85.6 91.0 88.7  PLT 312 302 311    Basic Metabolic Panel: Recent Labs  Lab 07/10/23 0240 07/11/23 0418 07/12/23 0445 07/13/23 0432  NA 138 142 140 140  K 4.2 4.2 4.3 4.5  CL 103 109 108 103  CO2 25 25 23 29   GLUCOSE 101* 92 92 90  BUN 10 13 11 9   CREATININE 0.66 0.56 0.61 0.60  CALCIUM 8.5* 8.8* 8.8* 8.9  MG  --   --  2.0 2.2  PHOS  --   --  4.0 4.4    GFR: Estimated Creatinine Clearance: 86.9 mL/min (by C-G formula based on SCr of 0.6 mg/dL).  Liver Function Tests: Recent Labs  Lab 07/10/23 0240 07/11/23 0418 07/12/23 0445 07/13/23 0432  AST 775* 672* 498* 314*  ALT 175* 222* 227* 211*  ALKPHOS 46 47 45 47  BILITOT 0.6 0.8 0.5 0.5  PROT 6.0* 6.3* 6.5 6.5  ALBUMIN 3.0* 3.1* 3.2* 3.2*    CBG: No results for input(s): "  GLUCAP" in the last 168 hours.   No results found for this or any previous visit (from the past 240 hours).       Radiology Studies: No results found.      Scheduled Meds:  docusate sodium   100 mg Oral BID   polyethylene glycol  17 g Oral Daily   Continuous Infusions:  lactated ringers  150 mL/hr at 07/13/23 0937     LOS: 2 days    Time spent: over 30 min    Donnetta Gains, MD [facility]   To contact the attending provider between 7A-7P or the covering provider during after hours 7P-7A, please log into the web site [email or web address] and access using universal [place] password for that web site. If you do not have the password, please call the  hospital operator.  07/13/2023, 3:27 PM

## 2023-07-13 NOTE — Plan of Care (Signed)

## 2023-07-14 ENCOUNTER — Encounter: Payer: Self-pay | Admitting: Nurse Practitioner

## 2023-07-14 DIAGNOSIS — M6282 Rhabdomyolysis: Secondary | ICD-10-CM | POA: Diagnosis not present

## 2023-07-14 LAB — COMPREHENSIVE METABOLIC PANEL WITH GFR
ALT: 187 U/L — ABNORMAL HIGH (ref 0–44)
AST: 165 U/L — ABNORMAL HIGH (ref 15–41)
Albumin: 3.7 g/dL (ref 3.5–5.0)
Alkaline Phosphatase: 51 U/L (ref 38–126)
Anion gap: 8 (ref 5–15)
BUN: 10 mg/dL (ref 6–20)
CO2: 30 mmol/L (ref 22–32)
Calcium: 9.3 mg/dL (ref 8.9–10.3)
Chloride: 102 mmol/L (ref 98–111)
Creatinine, Ser: 0.58 mg/dL (ref 0.44–1.00)
GFR, Estimated: >60 mL/min (ref >60)
Glucose, Bld: 80 mg/dL (ref 70–99)
Potassium: 4.1 mmol/L (ref 3.5–5.1)
Sodium: 140 mmol/L (ref 135–145)
Total Bilirubin: 0.5 mg/dL (ref 0.0–1.2)
Total Protein: 7.2 g/dL (ref 6.5–8.1)

## 2023-07-14 LAB — CBC WITH DIFFERENTIAL/PLATELET
Abs Immature Granulocytes: 0.01 10*3/uL (ref 0.00–0.07)
Basophils Absolute: 0 10*3/uL (ref 0.0–0.1)
Basophils Relative: 1 %
Eosinophils Absolute: 0.2 10*3/uL (ref 0.0–0.5)
Eosinophils Relative: 3 %
HCT: 35.3 % — ABNORMAL LOW (ref 36.0–46.0)
Hemoglobin: 10.8 g/dL — ABNORMAL LOW (ref 12.0–15.0)
Immature Granulocytes: 0 %
Lymphocytes Relative: 35 %
Lymphs Abs: 1.9 10*3/uL (ref 0.7–4.0)
MCH: 27.6 pg (ref 26.0–34.0)
MCHC: 30.6 g/dL (ref 30.0–36.0)
MCV: 90.1 fL (ref 80.0–100.0)
Monocytes Absolute: 0.4 10*3/uL (ref 0.1–1.0)
Monocytes Relative: 7 %
Neutro Abs: 2.9 10*3/uL (ref 1.7–7.7)
Neutrophils Relative %: 54 %
Platelets: 319 10*3/uL (ref 150–400)
RBC: 3.92 MIL/uL (ref 3.87–5.11)
RDW: 14.6 % (ref 11.5–15.5)
WBC: 5.4 10*3/uL (ref 4.0–10.5)
nRBC: 0 % (ref 0.0–0.2)

## 2023-07-14 LAB — PHOSPHORUS: Phosphorus: 4.8 mg/dL — ABNORMAL HIGH (ref 2.5–4.6)

## 2023-07-14 LAB — IRON AND TIBC
Iron: 63 ug/dL (ref 28–170)
Saturation Ratios: 15 % (ref 10.4–31.8)
TIBC: 409 ug/dL (ref 250–450)
UIBC: 346 ug/dL

## 2023-07-14 LAB — MAGNESIUM: Magnesium: 2.1 mg/dL (ref 1.7–2.4)

## 2023-07-14 LAB — FERRITIN: Ferritin: 106 ng/mL (ref 11–307)

## 2023-07-14 LAB — FOLATE: Folate: 14.6 ng/mL (ref >5.9)

## 2023-07-14 LAB — CK: Total CK: 14849 U/L — ABNORMAL HIGH (ref 38–234)

## 2023-07-14 LAB — VITAMIN B12: Vitamin B-12: 329 pg/mL (ref 180–914)

## 2023-07-14 NOTE — Discharge Summary (Signed)
 Physician Discharge Summary  Kathy Merritt AOZ:[id number] DOB: 1970-07-06 DOA: 07/09/2023  PCP: Susanna Epley, FNP  Admit date: 07/09/2023 Discharge date: 07/14/2023  Time spent: 40 minutes  Recommendations for Outpatient Follow-up:  Follow outpatient CBC/CMP  Follow CK/AST/ALT outpatient If abnormal LFT's after normalization of CK would follow RUQ US  Follow anemia    Discharge Diagnoses:  Principal Problem:   Rhabdomyolysis Active Problems:   Elevated transaminase level   Discharge Condition: stable  Diet recommendation: heart healthy  Filed Weights   07/13/23 0705  Weight: 81.8 kg    History of present illness:  53 year old woman presented with bilateral bicep soreness and elbow soreness as well as bilateral arm swelling following intense workout. Admitted for rhabdomyolysis.   CK improving.    Stable for discharge today.   Hospital Course:  Assessment and Plan:  Rhabdomyolysis Upper extremity swelling/pain after intense workup where she did hundreds of curls, battle ropes, planking CK peaked 4/29 at >50,000 - today ~27,000 UA at presentation with large Hb, 0-5 RBC's -> myoglobinuria She's done well overall, will discharge with plan to hydrate and follow repeat labs with outpatient PCP.  CK ~15000 today.     Elevated LFT's Related to rhabdo above Follow with resolution of above Consider RUQ US  - can pursue outpatient if persistently elevated, currently improving Follow acute hepatitis panel - negative  Anemia Mild Follow outpatient Normal iron, b12, folate, ferritin     Procedures: none   Consultations: none  Discharge Exam: Vitals:   07/14/23 0536 07/14/23 1303  BP: 136/80 116/74  Pulse: 61   Resp: 16 16  Temp: 97.8 F (36.6 C) 98.7 F (37.1 C)  SpO2: 100% 100%   Eager to go home  General: No acute distress. Cardiovascular: unlabored Lungs: RRR Abdomen: Soft, nontender, nondistended Neurological: Alert and oriented 3. Moves all  extremities 4 with equal strength. Cranial nerves II through XII grossly intact. Extremities: soft upper extremity compartments, improved swelling, good ROM  Discharge Instructions   Discharge Instructions     Call MD for:  difficulty breathing, headache or visual disturbances   Complete by: As directed    Call MD for:  extreme fatigue   Complete by: As directed    Call MD for:  hives   Complete by: As directed    Call MD for:  persistant dizziness or light-headedness   Complete by: As directed    Call MD for:  persistant nausea and vomiting   Complete by: As directed    Call MD for:  redness, tenderness, or signs of infection (pain, swelling, redness, odor or green/yellow discharge around incision site)   Complete by: As directed    Call MD for:  severe uncontrolled pain   Complete by: As directed    Call MD for:  temperature >100.4   Complete by: As directed    Diet - low sodium heart healthy   Complete by: As directed    Discharge instructions   Complete by: As directed    You were seen for rhabdomyolysis after your intense workout.  Your labs are improving, but still abnormal.  Given the improvement and your normal kidney function, it's ok for you to go home today.  Push fluids over the next week, try to get 2 liters Arva Slaugh day of fluids (your urine should be light yellow in color).    Repeat your labs with your PCP outpatient.  Your CK and kidney functio and liver function tests should be repeated outpatient.  If your liver  function tests remain abnormal, you should get Dimitriy Carreras liver ultrasound.    You had mild anemia, this can be followed outpatient.    I think your symptoms should be resolved before you return to work.  You can return to work if you're better after 5/9 or when cleared by your PCP.   Return for new, recurrent, or worsening symptoms.  Please ask your PCP to request records from this hospitalization so they know what was done and what the next steps will be.    Increase activity slowly   Complete by: As directed       Allergies as of 07/14/2023       Reactions   Pine Shortness Of Breath   wheezing   Prednisone  Other (See Comments)   severe constipation   Latex Itching, Rash   Other Rash   [place] plants        Medication List     STOP taking these medications    estradiol  0.05 mg/24hr patch Commonly known as: Climara    medroxyPROGESTERone  2.5 MG tablet Commonly known as: Provera        TAKE these medications    ascorbic acid 250 MG Chew Commonly known as: VITAMIN C Chew 250 mg by mouth daily.   BERBERINE COMPLEX PO Take 1 capsule by mouth daily at 12 noon.   cholecalciferol 25 MCG (1000 UNIT) tablet Commonly known as: VITAMIN D3 Take 1,000 Units by mouth daily.   Vitamin D3 125 MCG (5000 UT) Caps Take 1 capsule (5,000 Units total) by mouth daily.   MAGNESIUM GLYCINATE PO Take 1 capsule by mouth daily at 12 noon.   multivitamin with minerals tablet Take 1 tablet by mouth daily.   omega-3 acid ethyl esters 1 g capsule Commonly known as: LOVAZA Take 1 g by mouth 2 (two) times daily.       Allergies  Allergen Reactions   Pine Shortness Of Breath    wheezing   Prednisone  Other (See Comments)    severe constipation   Latex Itching and Rash   Other Rash    [place] plants      The results of significant diagnostics from this hospitalization (including imaging, microbiology, ancillary and laboratory) are listed below for reference.    Significant Diagnostic Studies: No results found.  Microbiology: No results found for this or any previous visit (from the past 240 hours).   Labs: Basic Metabolic Panel: Recent Labs  Lab 07/10/23 0240 07/11/23 0418 07/12/23 0445 07/13/23 0432 07/14/23 1120  NA 138 142 140 140 140  K 4.2 4.2 4.3 4.5 4.1  CL 103 109 108 103 102  CO2 25 25 23 29 30   GLUCOSE 101* 92 92 90 80  BUN 10 13 11 9 10   CREATININE 0.66 0.56 0.61 0.60 0.58  CALCIUM 8.5* 8.8* 8.8*  8.9 9.3  MG  --   --  2.0 2.2 2.1  PHOS  --   --  4.0 4.4 4.8*   Liver Function Tests: Recent Labs  Lab 07/10/23 0240 07/11/23 0418 07/12/23 0445 07/13/23 0432 07/14/23 1120  AST 775* 672* 498* 314* 165*  ALT 175* 222* 227* 211* 187*  ALKPHOS 46 47 45 47 51  BILITOT 0.6 0.8 0.5 0.5 0.5  PROT 6.0* 6.3* 6.5 6.5 7.2  ALBUMIN 3.0* 3.1* 3.2* 3.2* 3.7   No results for input(s): "LIPASE", "AMYLASE" in the last 168 hours. No results for input(s): "AMMONIA" in the last 168 hours. CBC: Recent Labs  Lab 07/09/23 2311 07/11/23 0418  07/13/23 0432 07/14/23 1120  WBC 7.2 6.3 5.7 5.4  NEUTROABS  --   --  2.6 2.9  HGB 12.0 10.7* 10.4* 10.8*  HCT 37.5 36.4 33.8* 35.3*  MCV 85.6 91.0 88.7 90.1  PLT 312 302 311 319   Cardiac Enzymes: Recent Labs  Lab 07/10/23 0120 07/11/23 0418 07/12/23 0445 07/13/23 0432 07/14/23 1120  CKTOTAL >20,000* >50,000* 45,877* 27,290* 14,849*   BNP: BNP (last 3 results) No results for input(s): "BNP" in the last 8760 hours.  ProBNP (last 3 results) No results for input(s): "PROBNP" in the last 8760 hours.  CBG: No results for input(s): "GLUCAP" in the last 168 hours.     Signed:  Donnetta Gains MD.  [facility] 07/14/2023, 2:46 PM

## 2023-07-14 NOTE — Plan of Care (Signed)
 RN called phlebotomy to see if they can redraw labs (unsuccessful attempt was done around 8:30 am); phlebotomy tech stated they will be here shortly

## 2023-07-14 NOTE — Plan of Care (Signed)
 Pt received, educated on, and understands discharge instructions.  All IV accessed removed.  Pt has all belongings.

## 2023-07-14 NOTE — Plan of Care (Signed)
  Problem: Pain Managment: Goal: General experience of comfort will improve and/or be controlled Outcome: Progressing   Problem: Safety: Goal: Ability to remain free from injury will improve Outcome: Progressing

## 2023-07-17 ENCOUNTER — Telehealth: Payer: Self-pay

## 2023-07-17 NOTE — Transitions of Care (Post Inpatient/ED Visit) (Signed)
   07/17/2023  Name: Kathy Merritt MRN: [id number] DOB: [birth year]  Today's TOC FU Call Status: Today's TOC FU Call Status:: Successful TOC FU Call Completed TOC FU Call Complete Date: 07/17/23 Patient's Name and Date of Birth confirmed.  Transition Care Management Follow-up Telephone Call Date of Discharge: 07/14/23 Discharge Facility: [facility]) Type of Discharge: Inpatient Admission Primary Inpatient Discharge Diagnosis:: Rhabdomyolysis How have you been since you were released from the hospital?: Better Any questions or concerns?: Yes Patient Questions/Concerns:: patient is concerned physical therapy was not ordered and would like to discuss this at her hospital follow up appointment. Patient Questions/Concerns Addressed: Notified Provider of Patient Questions/Concerns  Items Reviewed: Did you receive and understand the discharge instructions provided?: Yes Medications obtained,verified, and reconciled?: Yes (Medications Reviewed) Any new allergies since your discharge?: No Dietary orders reviewed?: NA Do you have support at home?: Yes People in Home [RPT]: other relative(s)  Medications Reviewed Today: Medications Reviewed Today     Reviewed by Chauncy Coral, CMA (Certified Medical Assistant) on 07/17/23 at 1428  Med List Status: <None>   Medication Order Taking? Sig Documenting Provider Last Dose Status Informant  ascorbic acid (VITAMIN C) 250 MG CHEW [id number] No Chew 250 mg by mouth daily. [provider] Past Week Active Self, Pharmacy Records, Multiple Informants  Barberry-Oreg Grape-Goldenseal (BERBERINE COMPLEX PO) [id number] No Take 1 capsule by mouth daily at 12 noon. [provider] Past Week Active Self, Pharmacy Records, Multiple Informants  cholecalciferol (VITAMIN D ) 25 MCG (1000 UNIT) tablet [id number] No Take 1,000 Units by mouth daily. [provider] Past Week Active Self, Pharmacy Records, Multiple Informants   Cholecalciferol (VITAMIN D3) 125 MCG (5000 UT) CAPS [id number] No Take 1 capsule (5,000 Units total) by mouth daily.  Patient not taking: Reported on 07/10/2023   Emilie Harden, MD Not Taking Active Self, Pharmacy Records, Multiple Informants  MAGNESIUM GLYCINATE PO [id number] No Take 1 capsule by mouth daily at 12 noon. [provider] Past Week Active Self, Pharmacy Records, Multiple Informants  Multiple Vitamins-Minerals (MULTIVITAMIN WITH MINERALS) tablet [id number] No Take 1 tablet by mouth daily. [provider] Past Week Active Self, Pharmacy Records, Multiple Informants  omega-3 acid ethyl esters (LOVAZA) 1 g capsule [id number] No Take 1 g by mouth 2 (two) times daily. [provider] Past Week Active Self, Pharmacy Records, Multiple Informants            Home Care and Equipment/Supplies: Were Home Health Services Ordered?: NA Any new equipment or medical supplies ordered?: NA  Functional Questionnaire: Do you need assistance with bathing/showering or dressing?: No Do you need assistance with meal preparation?: No Do you need assistance with eating?: No Do you have difficulty maintaining continence: No Do you need assistance with getting out of bed/getting out of a chair/moving?: No Do you have difficulty managing or taking your medications?: No  Follow up appointments reviewed: PCP Follow-up appointment confirmed?: Yes Date of PCP follow-up appointment?: 07/20/23 Follow-up Provider: Susanna Epley, FNP Specialist Hospital Follow-up appointment confirmed?: NA Do you need transportation to your follow-up appointment?: No Do you understand care options if your condition(s) worsen?: Yes-patient verbalized understanding    SIGNATURE Germain Kohler, CMA (AAMA)  [facility] [phone]

## 2023-07-20 ENCOUNTER — Ambulatory Visit: Admitting: Nurse Practitioner

## 2023-07-20 ENCOUNTER — Encounter: Payer: Self-pay | Admitting: Nurse Practitioner

## 2023-07-20 VITALS — BP 130/70 | HR 68 | Temp 98.5°F | Ht 65.0 in | Wt 178.8 lb

## 2023-07-20 DIAGNOSIS — Z6829 Body mass index (BMI) 29.0-29.9, adult: Secondary | ICD-10-CM | POA: Diagnosis not present

## 2023-07-20 DIAGNOSIS — R29898 Other symptoms and signs involving the musculoskeletal system: Secondary | ICD-10-CM

## 2023-07-20 DIAGNOSIS — D649 Anemia, unspecified: Secondary | ICD-10-CM | POA: Diagnosis not present

## 2023-07-20 DIAGNOSIS — Z2821 Immunization not carried out because of patient refusal: Secondary | ICD-10-CM | POA: Diagnosis not present

## 2023-07-20 DIAGNOSIS — E663 Overweight: Secondary | ICD-10-CM | POA: Diagnosis not present

## 2023-07-20 DIAGNOSIS — M6282 Rhabdomyolysis: Secondary | ICD-10-CM | POA: Diagnosis not present

## 2023-07-20 NOTE — Progress Notes (Addendum)
 [place], CMA,acting as a Neurosurgeon for Susanna Epley, FNP.,have documented all relevant documentation on the behalf of Susanna Epley, FNP,as directed by  Susanna Epley, FNP while in the presence of Susanna Epley, FNP.  Subjective:  Patient ID: Kathy Merritt , female    DOB: 18-Apr-1970 , 53 y.o.   MRN: [id number]  Chief Complaint  Patient presents with   Hospitalization Follow-up    Patient presents today for a hospital follow up, Patient reports compliance with medication. Patient denies any chest pain, SOB, or headaches. Patient was admitted on 07/07/2023-07/14/2023 she was diagnosed with Rhabdomyolysis. Patient reports today she is feeling okay, she reports her arm strength is not where it needs to be, she reports her arms get tired very fast.  She is still having arm weakness she is wondering if she could do PT.     HPI  Addendum: Patient is here today for hospital admission f/u she had done a strenuous workout on 4/25 (Friday), then had swelling to her arm and went to the ER on 4/27 (Sunday) at [facility] and was transferred for admission to [facility] on 4/28 (Monday).   She reports she had not worked out with this Curator for a while. It did not feel like too much. When she got to the end of her workout. When she got to set 4-5 she felt nauseated. She drank water during the workout. Sunday she was swollen, sore and tight and her arms were significantly larger. She took tylenol in morning and soaked in epsom salt and as the day progressed the swelling got worse, it got so large that she was afraid it would "burst".   She continues to have fatigue to her left arm. Has been doing extra activity during the day. She has not seen any providers since her discharge from the hospital. She has been urinating okay. She did have one episode prior to her going to the hospital had one dark urine. During her hospitalization her CK increased to >50,000. She also had elevated liver enzymes.    She has not returned to work at this time. She works at the [employer], sport and exercise on the computer and on the phone. She is staying with her friend and she is not driving. To avoid the temptation to overdue anything.   Will consider a modified work schedule      Past Medical History:  Diagnosis Date   Allergy    Anemia    Blood transfusion without reported diagnosis 2001   Elevated hemoglobin A1c    Fatigue    Pre-diabetes    Vitamin D  deficiency      Family History  Problem Relation Age of Onset   Healthy Mother    Diabetes Father    Hypertension Father    Cancer Father        liver and gallbladder   Dementia Maternal Grandmother    Colon cancer Neg Hx    Colon polyps Neg Hx    Esophageal cancer Neg Hx    Rectal cancer Neg Hx    Stomach cancer Neg Hx      Current Outpatient Medications:    ascorbic acid (VITAMIN C) 250 MG CHEW, Chew 250 mg by mouth daily., Disp: , Rfl:    Barberry-Oreg Grape-Goldenseal (BERBERINE COMPLEX PO), Take 1 capsule by mouth daily at 12 noon., Disp: , Rfl:    cholecalciferol (VITAMIN D ) 25 MCG (1000 UNIT) tablet, Take 1,000 Units by mouth daily., Disp: , Rfl:  MAGNESIUM GLYCINATE PO, Take 1 capsule by mouth daily at 12 noon., Disp: , Rfl:    Multiple Vitamins-Minerals (MULTIVITAMIN WITH MINERALS) tablet, Take 1 tablet by mouth daily., Disp: , Rfl:    omega-3 acid ethyl esters (LOVAZA) 1 g capsule, Take 1 g by mouth 2 (two) times daily., Disp: , Rfl:    Allergies  Allergen Reactions   Pine Shortness Of Breath    wheezing   Prednisone  Other (See Comments)    severe constipation   Latex Itching and Rash   Other Rash    [place] plants     Review of Systems  Constitutional: Negative.   Respiratory: Negative.    Cardiovascular: Negative.   Musculoskeletal: Negative.   Skin: Negative.   Neurological: Negative.   Psychiatric/Behavioral: Negative.       Today's Vitals   07/20/23 1410  BP: 130/70  Pulse: 68  Temp: 98.5 F (36.9 C)   TempSrc: Oral  Weight: 178 lb 12.8 oz (81.1 kg)  Height: 5\' 5"  (1.651 m)  PainSc: 0-No pain   Body mass index is 29.75 kg/m.  Wt Readings from Last 3 Encounters:  07/20/23 178 lb 12.8 oz (81.1 kg)  07/13/23 180 lb 5.4 oz (81.8 kg)  02/15/23 178 lb (80.7 kg)    Objective:  Physical Exam Vitals and nursing note reviewed.  Constitutional:      General: She is not in acute distress.    Appearance: Normal appearance.  Cardiovascular:     Rate and Rhythm: Normal rate and regular rhythm.     Pulses: Normal pulses.     Heart sounds: Normal heart sounds. No murmur heard. Pulmonary:     Effort: Pulmonary effort is normal. No respiratory distress.     Breath sounds: Normal breath sounds. No wheezing.  Skin:    General: Skin is warm.     Capillary Refill: Capillary refill takes less than 2 seconds.  Neurological:     General: No focal deficit present.     Mental Status: She is alert and oriented to person, place, and time.     Cranial Nerves: No cranial nerve deficit.     Motor: No weakness.     Assessment And Plan:  Non-traumatic rhabdomyolysis Assessment & Plan: TCM Performed. A member of the clinical team spoke with the patient upon dischare. Discharge summary was reviewed in full detail during the visit. Meds reconciled and compared to discharge meds. Medication list is updated and reviewed with the patient.  Greater than 50% face to face time was spent in counseling an coordination of care.  All questions were answered to the satisfaction of the patient.   Will repeat her CK the numbers were trending downwards. She continues to have left arm weakness at times and she continues to be out of work until Wednesday. She is encouraged to stay well hydrated. Will refer to PT for left arm weakness. I can not tell a difference with her strength on exam.   Orders: -     CMP14+EGFR -     CK  Anemia, unspecified type Assessment & Plan: Will recheck levels  Orders: -     CBC with  Differential/Platelet -     Iron, TIBC and Ferritin Panel  Bilateral arm weakness Assessment & Plan: Left worse than right. Will refer to PT for evaluation and treatment  Orders: -     Ambulatory referral to Physical Therapy  COVID-19 vaccination declined Assessment & Plan: Declines covid 19 vaccine. Discussed risk of covid  19 and if she changes her mind about the vaccine to call the office. Education has been provided regarding the importance of this vaccine but patient still declined. Advised may receive this vaccine at local pharmacy or Health Dept.or vaccine clinic. Aware to provide a copy of the vaccination record if obtained from local pharmacy or Health Dept.  Encouraged to take multivitamin, vitamin d , vitamin c and zinc to increase immune system. Aware can call office if would like to have vaccine here at office. Verbalized acceptance and understanding.    Overweight with body mass index (BMI) of 29 to 29.9 in adult    Return for keep same next.  Patient was given opportunity to ask questions. Patient verbalized understanding of the plan and was able to repeat key elements of the plan. All questions were answered to their satisfaction.   Inge Mangle, FNP, have reviewed all documentation for this visit. The documentation on 07/20/23 for the exam, diagnosis, procedures, and orders are all accurate and complete.    IF YOU HAVE BEEN REFERRED TO A SPECIALIST, IT MAY TAKE 1-2 WEEKS TO SCHEDULE/PROCESS THE REFERRAL. IF YOU HAVE NOT HEARD FROM US /SPECIALIST IN TWO WEEKS, PLEASE GIVE US  A CALL AT [phone] X [phone].

## 2023-07-21 LAB — IRON,TIBC AND FERRITIN PANEL
Ferritin: 183 ng/mL — ABNORMAL HIGH (ref 15–150)
Iron Saturation: 18 % (ref 15–55)
Iron: 66 ug/dL (ref 27–159)
Total Iron Binding Capacity: 369 ug/dL (ref 250–450)
UIBC: 303 ug/dL (ref 131–425)

## 2023-07-21 LAB — CBC WITH DIFFERENTIAL/PLATELET
Basophils Absolute: 0 10*3/uL (ref 0.0–0.2)
Basos: 1 %
EOS (ABSOLUTE): 0.1 10*3/uL (ref 0.0–0.4)
Eos: 1 %
Hematocrit: 36 % (ref 34.0–46.6)
Hemoglobin: 11.6 g/dL (ref 11.1–15.9)
Immature Grans (Abs): 0 10*3/uL (ref 0.0–0.1)
Immature Granulocytes: 0 %
Lymphocytes Absolute: 2.8 10*3/uL (ref 0.7–3.1)
Lymphs: 40 %
MCH: 27.4 pg (ref 26.6–33.0)
MCHC: 32.2 g/dL (ref 31.5–35.7)
MCV: 85 fL (ref 79–97)
Monocytes Absolute: 0.4 10*3/uL (ref 0.1–0.9)
Monocytes: 6 %
Neutrophils Absolute: 3.6 10*3/uL (ref 1.4–7.0)
Neutrophils: 52 %
Platelets: 387 10*3/uL (ref 150–450)
RBC: 4.24 x10E6/uL (ref 3.77–5.28)
RDW: 13.8 % (ref 11.7–15.4)
WBC: 6.9 10*3/uL (ref 3.4–10.8)

## 2023-07-21 LAB — CMP14+EGFR
ALT: 50 IU/L — ABNORMAL HIGH (ref 0–32)
AST: 21 IU/L (ref 0–40)
Albumin: 4.3 g/dL (ref 3.8–4.9)
Alkaline Phosphatase: 67 IU/L (ref 44–121)
BUN/Creatinine Ratio: 22 (ref 9–23)
BUN: 15 mg/dL (ref 6–24)
Bilirubin Total: <0.2 mg/dL (ref 0.0–1.2)
CO2: 23 mmol/L (ref 20–29)
Calcium: 9.2 mg/dL (ref 8.7–10.2)
Chloride: 104 mmol/L (ref 96–106)
Creatinine, Ser: 0.69 mg/dL (ref 0.57–1.00)
Globulin, Total: 2.8 g/dL (ref 1.5–4.5)
Glucose: 102 mg/dL — ABNORMAL HIGH (ref 70–99)
Potassium: 4.9 mmol/L (ref 3.5–5.2)
Sodium: 143 mmol/L (ref 134–144)
Total Protein: 7.1 g/dL (ref 6.0–8.5)
eGFR: 104 mL/min/{1.73_m2} (ref >59)

## 2023-07-21 LAB — CK: Total CK: 561 U/L (ref 32–182)

## 2023-07-24 ENCOUNTER — Encounter: Payer: Self-pay | Admitting: Nurse Practitioner

## 2023-07-24 DIAGNOSIS — R29898 Other symptoms and signs involving the musculoskeletal system: Secondary | ICD-10-CM | POA: Insufficient documentation

## 2023-07-24 DIAGNOSIS — E663 Overweight: Secondary | ICD-10-CM | POA: Insufficient documentation

## 2023-07-24 DIAGNOSIS — D649 Anemia, unspecified: Secondary | ICD-10-CM | POA: Insufficient documentation

## 2023-07-25 ENCOUNTER — Ambulatory Visit: Payer: Self-pay | Admitting: Nurse Practitioner

## 2023-07-26 ENCOUNTER — Other Ambulatory Visit: Payer: Self-pay | Admitting: Nurse Practitioner

## 2023-07-26 ENCOUNTER — Telehealth: Payer: Self-pay

## 2023-07-26 ENCOUNTER — Telehealth: Payer: Self-pay | Admitting: Nurse Practitioner

## 2023-07-26 DIAGNOSIS — Z1231 Encounter for screening mammogram for malignant neoplasm of breast: Secondary | ICD-10-CM

## 2023-07-26 NOTE — Telephone Encounter (Signed)
 Copied from CRM [id number]. Topic: General - Other >> Jul 26, 2023 11:30 AM Baldomero Bone wrote: Reason for CRM: Patient needs to extend her leave because she came back too soon. Leave number [id number]. Fax number for updated medication at [facility] [phone]. Patient is available for an appointment any day or any time if needed for the updated medical. Callback number is [phone].

## 2023-07-26 NOTE — Telephone Encounter (Signed)
 Copied from CRM [id number]. Topic: General - Other >> Jul 26, 2023 10:53 AM Oddis Bench wrote: Reason for CRM: Patient is calling bc she had documentation to stay out until today but she is still not feeling good and she is stating that her left arm is fatigued. She was released from the hospital on 05/02 with rhabdomyolysis. She wants to have the documentation for leave to be extended.

## 2023-07-26 NOTE — Telephone Encounter (Signed)
 Spoke with patient due to her requesting an extension for her time off. She went to work today and felt left arm fatigue and was having difficulty with getting through the day. She has not started PT. Will extend time off for one more week then she will go to work for half day for one week then full day after. Will update her FMLA.

## 2023-07-26 NOTE — Telephone Encounter (Signed)
Provider talked with patient

## 2023-07-30 ENCOUNTER — Encounter: Payer: Self-pay | Admitting: Nurse Practitioner

## 2023-07-30 NOTE — Assessment & Plan Note (Signed)

## 2023-07-30 NOTE — Assessment & Plan Note (Signed)
 TCM Performed. A member of the clinical team spoke with the patient upon dischare. Discharge summary was reviewed in full detail during the visit. Meds reconciled and compared to discharge meds. Medication list is updated and reviewed with the patient.  Greater than 50% face to face time was spent in counseling an coordination of care.  All questions were answered to the satisfaction of the patient.   Will repeat her CK the numbers were trending downwards. She continues to have left arm weakness at times and she continues to be out of work until Wednesday. She is encouraged to stay well hydrated. Will refer to PT for left arm weakness. I can not tell a difference with her strength on exam.

## 2023-07-30 NOTE — Assessment & Plan Note (Signed)
 Will recheck levels

## 2023-07-30 NOTE — Assessment & Plan Note (Signed)
 Left worse than right. Will refer to PT for evaluation and treatment

## 2023-07-31 ENCOUNTER — Encounter: Payer: Self-pay | Admitting: Nurse Practitioner

## 2023-08-01 ENCOUNTER — Encounter: Payer: Self-pay | Admitting: Nurse Practitioner

## 2023-08-01 ENCOUNTER — Ambulatory Visit (INDEPENDENT_AMBULATORY_CARE_PROVIDER_SITE_OTHER): Admitting: Rehabilitative and Restorative Service Providers"

## 2023-08-01 ENCOUNTER — Encounter: Payer: Self-pay | Admitting: Rehabilitative and Restorative Service Providers"

## 2023-08-01 DIAGNOSIS — M6282 Rhabdomyolysis: Secondary | ICD-10-CM

## 2023-08-01 DIAGNOSIS — M6281 Muscle weakness (generalized): Secondary | ICD-10-CM

## 2023-08-01 DIAGNOSIS — M79602 Pain in left arm: Secondary | ICD-10-CM | POA: Diagnosis not present

## 2023-08-01 DIAGNOSIS — M79601 Pain in right arm: Secondary | ICD-10-CM

## 2023-08-01 NOTE — Therapy (Signed)
 OUTPATIENT PHYSICAL THERAPY EVALUATION   Patient Name: Kathy Merritt MRN: [id number] DOB:Oct 17, 1970, 53 y.o., female Today's Date: 08/01/2023  END OF SESSION:  PT End of Session - 08/01/23 1340     Visit Number 1    Number of Visits 20    Date for PT Re-Evaluation 10/10/23    Authorization Type [employer] PPO    Progress Note Due on Visit 10    PT Start Time 1343    PT Stop Time 1427    PT Time Calculation (min) 44 min    Activity Tolerance Patient tolerated treatment well    Behavior During Therapy WFL for tasks assessed/performed             Past Medical History:  Diagnosis Date   Allergy    Anemia    Blood transfusion without reported diagnosis 2001   Elevated hemoglobin A1c    Fatigue    Pre-diabetes    Vitamin D  deficiency    Past Surgical History:  Procedure Laterality Date   CESAREAN SECTION  1995, 2001   x 2   TOOTH EXTRACTION     WISDOM TOOTH EXTRACTION     Patient Active Problem List   Diagnosis Date Noted   Overweight with body mass index (BMI) of 29 to 29.9 in adult 07/24/2023   Anemia 07/24/2023   Bilateral arm weakness 07/24/2023   Elevated transaminase level 07/11/2023   Rhabdomyolysis 07/10/2023   Subclinical hyperthyroidism 02/15/2023   Vitamin D  insufficiency 02/15/2023   Abnormal glucose 12/11/2022   Sore throat 12/11/2022   Acute nonintractable headache 12/11/2022   COVID-19 vaccination declined 12/11/2022   Influenza vaccination declined 12/11/2022   Menopause 08/21/2022   Abnormal thyroid  function test 08/21/2022   Aortic stenosis 12/24/2021   Fatigue 12/24/2021   Normocytic anemia 09/15/2020    PCP: Susanna Epley, FNP  REFERRING PROVIDER: Susanna Epley, FNP  REFERRING DIAG: [phone] (ICD-10-CM) - Bilateral arm weakness  THERAPY DIAG:  Muscle weakness (generalized)  Non-traumatic rhabdomyolysis  Pain in left arm  Pain in right arm  Rationale for Evaluation and Treatment: Rehabilitation  ONSET  DATE: Following workout 07/07/2023  SUBJECTIVE:                                                                                                                                                                                                         SUBJECTIVE STATEMENT: Pt indicated having return to workout harder  Pt indicated chief complaints at this time having fatigue complaints related prolonged activity that can last for hours after resting.  Pt indicated indicated no pain limitations for sleeping but can feel some fatigue complaints pending arm positioning.  Pt indicated having   Rt hand dominant but does use both.   PERTINENT HISTORY:  Anxiety.  Dx of Rhabdomyolysis from ED 07/09/2023. Recent history of Rt shoulder tightness for about a year in 2024.   PAIN:  NPRS scale: current 3/10, at worst 6-7/10 Pain location: bilateral arm  Pain description: throbbing /fatigue  Aggravating factors: increased activity load with UE.  Relieving factors: rest, light massage, stretching/extension of arms.   PRECAUTIONS: None  RED FLAGS: None  WEIGHT BEARING RESTRICTIONS: No  FALLS:  Has patient fallen in last 6 months? No  LIVING ENVIRONMENT: Lives in: House/apartment  OCCUPATION: Patient access advocate 2, mainly desk related work.   PLOF: Independent, workouts, singing at church, playing tamborine.   Does hair care for others at times.   PATIENT GOALS: Reduce symptoms, get back to activities.  Understanding how to respond to activity.    OBJECTIVE:   PATIENT SURVEYS: Patient-Specific Activity Scoring Scheme  "0" represents "unable to perform." "10" represents "able to perform at prior level. 0 1 2 3 4 5 6 7 8 9  10 (Date and Score)   Activity Eval  08/01/2023    1. Typing for work 6     2. Lifting heavy objects  0    3. Workouts activities  0   4. Holding smaller objects for longer periods (microphone, phone) 7   5. Driving 7   Score 4.0 avg    Total score = sum of the  activity scores/number of activities Minimum detectable change (90%CI) for average score = 2 points Minimum detectable change (90%CI) for single activity score = 3 points   COGNITION: 08/01/2023 Overall cognitive status: Within functional limits for tasks assessed  SENSATION: 08/01/2023 [facility]  POSTURE:  08/01/2023 Unremarkable   PALPATION: 08/01/2023 Not tested  UPPER EXTREMITY ROM:   ROM Right Eval 08/01/2023 Left Eval 08/01/2023  Shoulder flexion [facility]  Shoulder extension    Shoulder abduction [facility]  Shoulder adduction    Shoulder extension    Shoulder internal rotation    Shoulder external rotation    Elbow flexion    Elbow extension    Wrist flexion    Wrist extension    Wrist ulnar deviation    Wrist radial deviation    Wrist pronation    Wrist supination     (Blank rows = not tested)  UPPER EXTREMITY MMT:  MMT Right Eval 08/01/2023 Left Eval 08/01/2023  Shoulder flexion 5/5 5/5  Shoulder extension    Shoulder abduction 5/5 5/5  Shoulder adduction    Shoulder extension    Shoulder internal rotation 5/5 5/5  Shoulder external rotation 5/5 5/5  Middle trapezius    Lower trapezius    Elbow flexion 5/5 5/5  Elbow extension 5/5 5/5  Wrist flexion    Wrist extension    Wrist ulnar deviation    Wrist radial deviation    Wrist pronation    Wrist supination    Grip strength     (Blank rows = not tested)  SPECIAL TESTS:  08/01/2023 No specific testing.   FUNCTIONAL TESTS:  08/01/2023 No specific testing.  TODAY'S TREATMENT:                                                                                                       DATE:  08/01/2023 Therex:    HEP instruction/performance c cues for techniques, handout provided.  Trial set performed of each for comprehension and  symptom assessment.  See below for exercise list  Self care Education on activity tolerance and adjustment based off pain monitoring scale (handout provided).  Emphasis on endurance gains vs. High strengthening.   PATIENT EDUCATION:  08/01/2023 Education details: HEP, POC Person educated: Patient Education method: Programmer, multimedia, Demonstration, Verbal cues, and Handouts Education comprehension: verbalized understanding, returned demonstration, and verbal cues required  HOME EXERCISE PROGRAM: Access Code: [email or web address] URL: [email or web address] Date: 08/01/2023 Prepared by: Bonna Bustard  Exercises - Standing Bilateral Low Shoulder Row with Anchored Resistance  - 1 x daily - 7 x weekly - 1-2 sets - 10-15 reps - Shoulder Extension with Resistance  - 1 x daily - 7 x weekly - 1-2 sets - 10-15 reps - Shoulder External Rotation and Scapular Retraction with Resistance  - 1 x daily - 7 x weekly - 1-2 sets - 10-15 reps - Standing Single Arm Elbow Flexion with Resistance  - 1 x daily - 7 x weekly - 1-2 sets - 10-15 reps - Standing Elbow Extension with Self-Anchored Resistance  - 1 x daily - 7 x weekly - 1-2 sets - 10-15 reps  ASSESSMENT:  CLINICAL IMPRESSION: Patient is a 53 y.o. who comes to clinic with complaints of bilateral arm symptoms with weakness, fatigue deficits that impair their ability to perform usual daily and recreational functional activities without increase difficulty/symptoms at this time.  Patient to benefit from skilled PT services to address impairments and limitations to improve to previous level of function without restriction secondary to condition.    OBJECTIVE IMPAIRMENTS: decreased activity tolerance, decreased endurance, decreased strength, increased fascial restrictions, impaired perceived functional ability, impaired UE functional use, improper body mechanics, and pain.   ACTIVITY LIMITATIONS: carrying, lifting, reach over head, and  hygiene/grooming  PARTICIPATION LIMITATIONS: meal prep, cleaning, laundry, driving, shopping, community activity, occupation, and workouts  PERSONAL FACTORS: Specific dx of Rhabdomyolysis, history of Rt shoulder complaints are also affecting patient's functional outcome.   REHAB POTENTIAL: Good  CLINICAL DECISION MAKING: Evolving/moderate complexity  EVALUATION COMPLEXITY: Moderate   GOALS: Goals reviewed with patient? Yes  SHORT TERM GOALS: (target date for Short term goals are 3 weeks 08/22/2023)  1.Patient will demonstrate independent use of home exercise program to maintain progress from in clinic treatments. Goal status: New  LONG TERM GOALS: (target dates for all long term goals are 10 weeks  10/10/2023 )   1. Patient will demonstrate/report pain at worst less than or equal to 2/10 to facilitate minimal limitation in daily activity secondary to pain symptoms. Goal status: New   2. Patient will demonstrate independent use of home exercise program to facilitate ability to maintain/progress functional gains from skilled physical therapy services. Goal status: New   3. Patient will demonstrate Patient specific functional  scale avg > or = 8/10 to indicate reduced disability due to condition.  Goal status: New   4.  Patient will report return to work at [employer] s limitation.   Goal status: New   5.  Patient will report ability to perform workout activity.  Goal status: New      PLAN:  PT FREQUENCY: 1-2x/week  PT DURATION: 10 weeks  Can include 40981- PT Re-evaluation, 97110-Therapeutic exercises, 97530- Therapeutic activity, 97112- Neuromuscular re-education, 97535- Self Care, 97140- Manual therapy, [phone]- Gait training, [phone]- Orthotic Fit/training, [phone]- Canalith repositioning, [id number]- Aquatic Therapy, [phone] Electrical stimulation (unattended), [phone]- Electrical stimulation (manual), [id number]- Vasopneumatic device, [id number]- Ultrasound, [id number] Physical performance testing,  [id number]- Traction (mechanical), [id number]- Ionotophoresis 4mg /ml Dexamethasone, Patient/Family education, Balance training, Stair training, Taping, Dry Needling, Joint mobilization, Joint manipulation, Spinal manipulation, Spinal mobilization, Scar mobilization, Vestibular training, Visual/preceptual remediation/compensation, DME instructions, Cryotherapy, and Moist heat.  All performed as medically necessary.  All included unless contraindicated  PLAN FOR NEXT SESSION: Check HEP use/response  Slow progression on endurance for UE use.    Bonna Bustard, PT, DPT, OCS, ATC 08/01/23  2:59 PM

## 2023-08-02 ENCOUNTER — Ambulatory Visit
Admission: RE | Admit: 2023-08-02 | Discharge: 2023-08-02 | Disposition: A | Discharge status: Home / Self Care | Source: Ambulatory Visit

## 2023-08-02 DIAGNOSIS — Z1231 Encounter for screening mammogram for malignant neoplasm of breast: Secondary | ICD-10-CM

## 2023-08-12 ENCOUNTER — Ambulatory Visit (HOSPITAL_BASED_OUTPATIENT_CLINIC_OR_DEPARTMENT_OTHER): Payer: Self-pay | Attending: Nurse Practitioner | Admitting: Physical Therapy

## 2023-08-12 ENCOUNTER — Encounter (HOSPITAL_BASED_OUTPATIENT_CLINIC_OR_DEPARTMENT_OTHER): Payer: Self-pay | Admitting: Physical Therapy

## 2023-08-12 DIAGNOSIS — M79601 Pain in right arm: Secondary | ICD-10-CM | POA: Diagnosis not present

## 2023-08-12 DIAGNOSIS — M6282 Rhabdomyolysis: Secondary | ICD-10-CM | POA: Diagnosis not present

## 2023-08-12 DIAGNOSIS — M6281 Muscle weakness (generalized): Secondary | ICD-10-CM | POA: Diagnosis not present

## 2023-08-12 DIAGNOSIS — M79602 Pain in left arm: Secondary | ICD-10-CM | POA: Diagnosis not present

## 2023-08-12 NOTE — Therapy (Signed)
 OUTPATIENT PHYSICAL THERAPY TREATMENT   Patient Name: Kathy Merritt MRN: [id number] DOB:Nov 20, 1970, 53 y.o., female Today's Date: 08/12/2023  END OF SESSION:  PT End of Session - 08/12/23 0838     Visit Number 2    Number of Visits 20    Date for PT Re-Evaluation 10/10/23    Authorization Type [place] Employee [employer] PPO    Progress Note Due on Visit 10    PT Start Time [phone]    PT Stop Time 0915    PT Time Calculation (min) 40 min    Activity Tolerance Patient tolerated treatment well    Behavior During Therapy [facility] for tasks assessed/performed             Past Medical History:  Diagnosis Date   Allergy    Anemia    Blood transfusion without reported diagnosis 2001   Elevated hemoglobin A1c    Fatigue    Pre-diabetes    Vitamin D  deficiency    Past Surgical History:  Procedure Laterality Date   CESAREAN SECTION  1995, 2001   x 2   TOOTH EXTRACTION     WISDOM TOOTH EXTRACTION     Patient Active Problem List   Diagnosis Date Noted   Overweight with body mass index (BMI) of 29 to 29.9 in adult 07/24/2023   Anemia 07/24/2023   Bilateral arm weakness 07/24/2023   Elevated transaminase level 07/11/2023   Rhabdomyolysis 07/10/2023   Subclinical hyperthyroidism 02/15/2023   Vitamin D  insufficiency 02/15/2023   Abnormal glucose 12/11/2022   Sore throat 12/11/2022   Acute nonintractable headache 12/11/2022   COVID-19 vaccination declined 12/11/2022   Influenza vaccination declined 12/11/2022   Menopause 08/21/2022   Abnormal thyroid  function test 08/21/2022   Aortic stenosis 12/24/2021   Fatigue 12/24/2021   Normocytic anemia 09/15/2020    PCP: Susanna Epley, FNP  REFERRING PROVIDER: Susanna Epley, FNP  REFERRING DIAG: [phone] (ICD-10-CM) - Bilateral arm weakness  THERAPY DIAG:  Non-traumatic rhabdomyolysis  Pain in left arm  Pain in right arm  Muscle weakness (generalized)  Rationale for Evaluation and Treatment: Rehabilitation  ONSET  DATE: Following workout 07/07/2023  SUBJECTIVE:                                                                                                                                                                                                         SUBJECTIVE STATEMENT: Pt reports she has gradually increased work duty hours.  Wednesday was first day of full time work.  Has been having symptoms in primarily L fingers to mid-upper  arm (tingles, ache). She states she has been using the scale Bambi Lever gave her, to work within 2-5, but to stay around a 4.    From initial evaluation: Pt indicated having return to workout harder  Pt indicated chief complaints at this time having fatigue complaints related prolonged activity that can last for hours after resting.  Pt indicated indicated no pain limitations for sleeping but can feel some fatigue complaints pending arm positioning.  Pt indicated having   Rt hand dominant but does use both.   PERTINENT HISTORY:  Anxiety.  Dx of Rhabdomyolysis from ED 07/09/2023. Recent history of Rt shoulder tightness for about a year in 2024.   PAIN:  NPRS scale: current 4-5/10,  Pain location: bilateral arm  Pain description: throbbing /fatigue  Aggravating factors: increased activity load with UE.  Relieving factors: rest, light massage, stretching/extension of arms.   PRECAUTIONS: None  RED FLAGS: None  WEIGHT BEARING RESTRICTIONS: No  FALLS:  Has patient fallen in last 6 months? No  LIVING ENVIRONMENT: Lives in: House/apartment  OCCUPATION: Patient access advocate 2, mainly desk related work.   PLOF: Independent, workouts, singing at church, playing tamborine.   Does hair care for others at times.   PATIENT GOALS: Reduce symptoms, get back to activities.  Understanding how to respond to activity.    OBJECTIVE:   PATIENT SURVEYS: Patient-Specific Activity Scoring Scheme  "0" represents "unable to perform." "10" represents "able to perform at  prior level. 0 1 2 3 4 5 6 7 8 9  10 (Date and Score)   Activity Eval  08/01/2023    1. Typing for work 6     2. Lifting heavy objects  0    3. Workouts activities  0   4. Holding smaller objects for longer periods (microphone, phone) 7   5. Driving 7   Score 4.0 avg    Total score = sum of the activity scores/number of activities Minimum detectable change (90%CI) for average score = 2 points Minimum detectable change (90%CI) for single activity score = 3 points   COGNITION: 08/01/2023 Overall cognitive status: Within functional limits for tasks assessed  SENSATION: 08/01/2023 [facility]  POSTURE:  08/01/2023 Unremarkable   PALPATION: 08/01/2023 Not tested  UPPER EXTREMITY ROM:   ROM Right Eval 08/01/2023 Left Eval 08/01/2023  Shoulder flexion [facility]  Shoulder extension    Shoulder abduction [facility] [address] [facility]  Shoulder adduction    Shoulder extension    Shoulder internal rotation    Shoulder external rotation    Elbow flexion    Elbow extension    Wrist flexion    Wrist extension    Wrist ulnar deviation    Wrist radial deviation    Wrist pronation    Wrist supination     (Blank rows = not tested)  UPPER EXTREMITY MMT:  MMT Right Eval 08/01/2023 Left Eval 08/01/2023  Shoulder flexion 5/5 5/5  Shoulder extension    Shoulder abduction 5/5 5/5  Shoulder adduction    Shoulder extension    Shoulder internal rotation 5/5 5/5  Shoulder external rotation 5/5 5/5  Middle trapezius    Lower trapezius    Elbow flexion 5/5 5/5  Elbow extension 5/5 5/5  Wrist flexion    Wrist extension    Wrist ulnar deviation    Wrist radial deviation    Wrist pronation    Wrist supination    Grip strength     (Blank rows = not tested)  SPECIAL TESTS:  08/01/2023 No  specific testing.   FUNCTIONAL TESTS:  08/01/2023 No specific testing.                                                                                                                                                                                    TODAY'S TREATMENT:                                                                                                       [facility] Adult PT Treatment:                                                DATE: 08/12/23  Therapeutic Exercise: UBE, L1, 30s forward/backward/forward 3 position doorway stretches (trial of bent and straight elbows)- multiple reps with cues for form Snow angel x 3 -> on 1/2 foam roll -> full foam roll On full foam roller:  horiz abdct/ add with red band x 10; bilat shoulder ER red band x 8 Reviewed current HEP exercises with red band:  bilat shoulder ext x 5; tricep ext x 5 (with band in door); verbally reviewed row/bicep curl Shown thoracic ext over foam roller  Self Care: Instructed in use of cane massager to periscapular musculature; returned demo    DATE:  08/01/2023 Therex:    HEP instruction/performance c cues for techniques, handout provided.  Trial set performed of each for comprehension and symptom assessment.  See below for exercise list  Self care Education on activity tolerance and adjustment based off pain monitoring scale (handout provided).  Emphasis on endurance gains vs. High strengthening.   PATIENT EDUCATION:  Education details: HEP, POC Person educated: Patient Education method: Solicitor, Verbal cues Education comprehension: verbalized understanding, returned demonstration, and verbal cues required  HOME EXERCISE PROGRAM: Access Code: [email or web address] URL: [email or web address]   ASSESSMENT:  CLINICAL IMPRESSION: Pt reported good relief with UE stretches added to HEP.  Overall pain level in arm lowered to 2/10 at end of session. Updated HEP and issued red band for improved tolerance. Goals are ongoing.    From initial evaluation:  Patient is a 53 y.o. who comes to  clinic with complaints of bilateral arm symptoms with weakness, fatigue deficits that impair their ability to perform usual  daily and recreational functional activities without increase difficulty/symptoms at this time.  Patient to benefit from skilled PT services to address impairments and limitations to improve to previous level of function without restriction secondary to condition.    OBJECTIVE IMPAIRMENTS: decreased activity tolerance, decreased endurance, decreased strength, increased fascial restrictions, impaired perceived functional ability, impaired UE functional use, improper body mechanics, and pain.   ACTIVITY LIMITATIONS: carrying, lifting, reach over head, and hygiene/grooming  PARTICIPATION LIMITATIONS: meal prep, cleaning, laundry, driving, shopping, community activity, occupation, and workouts  PERSONAL FACTORS: Specific dx of Rhabdomyolysis, history of Rt shoulder complaints are also affecting patient's functional outcome.   REHAB POTENTIAL: Good  CLINICAL DECISION MAKING: Evolving/moderate complexity  EVALUATION COMPLEXITY: Moderate   GOALS: Goals reviewed with patient? Yes  SHORT TERM GOALS: (target date for Short term goals are 3 weeks 08/22/2023)  1.Patient will demonstrate independent use of home exercise program to maintain progress from in clinic treatments. Goal status: New  LONG TERM GOALS: (target dates for all long term goals are 10 weeks  10/10/2023 )   1. Patient will demonstrate/report pain at worst less than or equal to 2/10 to facilitate minimal limitation in daily activity secondary to pain symptoms. Goal status: New   2. Patient will demonstrate independent use of home exercise program to facilitate ability to maintain/progress functional gains from skilled physical therapy services. Goal status: New   3. Patient will demonstrate Patient specific functional scale avg > or = 8/10 to indicate reduced disability due to condition.  Goal status: New   4.  Patient will report return to work at [employer] s limitation.   Goal status: New   5.  Patient will report ability to  perform workout activity.  Goal status: New      PLAN:  PT FREQUENCY: 1-2x/week  PT DURATION: 10 weeks  Can include 04540- PT Re-evaluation, 97110-Therapeutic exercises, 97530- Therapeutic activity, 97112- Neuromuscular re-education, 97535- Self Care, 97140- Manual therapy, [phone]- Gait training, [phone]- Orthotic Fit/training, [phone]- Canalith repositioning, [id number]- Aquatic Therapy, [phone] Electrical stimulation (unattended), [phone]- Electrical stimulation (manual), [id number]- Vasopneumatic device, [id number]- Ultrasound, [id number] Physical performance testing, [id number]- Traction (mechanical), [id number]- Ionotophoresis 4mg /ml Dexamethasone, Patient/Family education, Balance training, Stair training, Taping, Dry Needling, Joint mobilization, Joint manipulation, Spinal manipulation, Spinal mobilization, Scar mobilization, Vestibular training, Visual/preceptual remediation/compensation, DME instructions, Cryotherapy, and Moist heat.  All performed as medically necessary.  All included unless contraindicated  PLAN FOR NEXT SESSION: Check HEP use/response  Slow progression on endurance for UE use.   Bernis Stecher, PTA 08/12/23 9:58 AM [facility] [address] [place], [place], [address] Phone: [phone]   Fax:  [phone]

## 2023-08-23 ENCOUNTER — Encounter: Payer: Commercial Managed Care - PPO | Admitting: Nurse Practitioner

## 2023-08-26 ENCOUNTER — Encounter (HOSPITAL_BASED_OUTPATIENT_CLINIC_OR_DEPARTMENT_OTHER): Payer: Self-pay | Admitting: Physical Therapy

## 2023-08-26 ENCOUNTER — Ambulatory Visit (HOSPITAL_BASED_OUTPATIENT_CLINIC_OR_DEPARTMENT_OTHER): Admitting: Physical Therapy

## 2023-08-30 ENCOUNTER — Ambulatory Visit (INDEPENDENT_AMBULATORY_CARE_PROVIDER_SITE_OTHER): Admitting: Nurse Practitioner

## 2023-08-30 ENCOUNTER — Encounter: Payer: Self-pay | Admitting: Nurse Practitioner

## 2023-08-30 VITALS — BP 120/80 | HR 77 | Temp 98.8°F | Ht 65.0 in | Wt 178.6 lb

## 2023-08-30 DIAGNOSIS — R7309 Other abnormal glucose: Secondary | ICD-10-CM | POA: Diagnosis not present

## 2023-08-30 DIAGNOSIS — Z2821 Immunization not carried out because of patient refusal: Secondary | ICD-10-CM

## 2023-08-30 DIAGNOSIS — Z0000 Encounter for general adult medical examination without abnormal findings: Secondary | ICD-10-CM | POA: Diagnosis not present

## 2023-08-30 DIAGNOSIS — E559 Vitamin D deficiency, unspecified: Secondary | ICD-10-CM

## 2023-08-30 DIAGNOSIS — R5383 Other fatigue: Secondary | ICD-10-CM | POA: Diagnosis not present

## 2023-08-30 DIAGNOSIS — M6282 Rhabdomyolysis: Secondary | ICD-10-CM | POA: Diagnosis not present

## 2023-08-30 DIAGNOSIS — R946 Abnormal results of thyroid function studies: Secondary | ICD-10-CM | POA: Diagnosis not present

## 2023-08-30 NOTE — Assessment & Plan Note (Signed)
 HgbA1c is stable. Continue focusing on healthy diet low in sugar and starches.

## 2023-08-30 NOTE — Assessment & Plan Note (Signed)

## 2023-08-30 NOTE — Assessment & Plan Note (Addendum)
 Will recheck CK one more time to see if improving. Symptoms have improved significantly. If levels improve she can slowly return to exercising regularly

## 2023-08-30 NOTE — Patient Instructions (Signed)
 Health Maintenance  Topic Date Due   COVID-19 Vaccine (2 - Janssen risk series) 09/15/2023*   Pneumococcal Vaccination (1 of 2 - PCV) 07/19/2024*   Flu Shot  10/13/2023   Mammogram  08/01/2025   Pap with HPV screening  04/04/2028   DTaP/Tdap/Td vaccine (2 - Td or Tdap) 12/18/2029   Colon Cancer Screening  10/31/2030   Hepatitis C Screening  Completed   HIV Screening  Completed   Zoster (Shingles) Vaccine  Completed   HPV Vaccine  Aged Out   Meningitis B Vaccine  Aged Out  *Topic was postponed. The date shown is not the original due date.

## 2023-08-30 NOTE — Progress Notes (Signed)
 [place], CMA,acting as a Neurosurgeon for Susanna Epley, FNP.,have documented all relevant documentation on the behalf of Susanna Epley, FNP,as directed by  Susanna Epley, FNP while in the presence of Susanna Epley, FNP.  Subjective:    Patient ID: Kathy Merritt , female    DOB: 10-24-1970 , 53 y.o.   MRN: [id number]  Chief Complaint  Patient presents with   Annual Exam    Patient presents today for HM, Patient reports compliance with medication. Patient denies any chest pain, SOB, or headaches.    Hormone    Patient reports she has intermitted fatigue she knows its related to her hormones. Patient reports she was going to endo but since her recent diagnoses of Rhabdo she has been told to stop the hormone pills. Patient reports she would like to see a nutrient specialist   to manage her hormones through her diet.     HPI  Here for HM. She sees a GYN.  She has improvement to her left arm. She went to PT once.  She was prescribed estrogen patches and pill from Endo.      Past Medical History:  Diagnosis Date   Allergy    Anemia    Blood transfusion without reported diagnosis 2001   Elevated hemoglobin A1c    Fatigue    Pre-diabetes    Vitamin D  deficiency      Family History  Problem Relation Age of Onset   Healthy Mother    Diabetes Father    Hypertension Father    Cancer Father        liver and gallbladder   Dementia Maternal Grandmother    Colon cancer Neg Hx    Colon polyps Neg Hx    Esophageal cancer Neg Hx    Rectal cancer Neg Hx    Stomach cancer Neg Hx      Current Outpatient Medications:    ascorbic acid (VITAMIN C) 250 MG CHEW, Chew 250 mg by mouth daily., Disp: , Rfl:    Barberry-Oreg Grape-Goldenseal (BERBERINE COMPLEX PO), Take 1 capsule by mouth daily at 12 noon., Disp: , Rfl:    cholecalciferol (VITAMIN D ) 25 MCG (1000 UNIT) tablet, Take 1,000 Units by mouth daily., Disp: , Rfl:    MAGNESIUM GLYCINATE PO, Take 1 capsule by mouth daily at 12 noon.,  Disp: , Rfl:    Multiple Vitamins-Minerals (MULTIVITAMIN WITH MINERALS) tablet, Take 1 tablet by mouth daily., Disp: , Rfl:    omega-3 acid ethyl esters (LOVAZA) 1 g capsule, Take 1 g by mouth 2 (two) times daily., Disp: , Rfl:    Allergies  Allergen Reactions   Pine Shortness Of Breath    wheezing   Prednisone  Other (See Comments)    severe constipation   Latex Itching and Rash   Other Rash    [place] plants      The patient states she uses post menopausal status for birth control. Patient's last menstrual period was 07/18/2013.. Negative for Dysmenorrhea and Negative for Menorrhagia. Negative for: breast discharge, breast lump(s), breast pain and breast self exam. Associated symptoms include abnormal vaginal bleeding. Pertinent negatives include abnormal bleeding (hematology), anxiety, decreased libido, depression, difficulty falling sleep, dyspareunia, history of infertility, nocturia, sexual dysfunction, sleep disturbances, urinary incontinence, urinary urgency, vaginal discharge and vaginal itching. Diet regular; she had been on a no sugar added diet since May. She has increased her intake of spinach. She is eating a hot cereal with high fiber. The patient states her exercise level  is minimal with walking for the first time since being in the hospital. She is on a new schedule for a few months. She has a stand up desk.   The patient's tobacco use is:  Social History   Tobacco Use  Smoking Status Never  Smokeless Tobacco Never   She has been exposed to passive smoke. The patient's alcohol use is:  Social History   Substance and Sexual Activity  Alcohol Use Not Currently   Additional information: Last pap 04/05/2023, next one scheduled for 04/04/2026.    Review of Systems  Constitutional: Negative.   HENT: Negative.    Eyes: Negative.   Respiratory: Negative.    Cardiovascular: Negative.   Gastrointestinal: Negative.   Endocrine: Negative.   Genitourinary: Negative.    Musculoskeletal: Negative.   Skin: Negative.   Allergic/Immunologic: Negative.   Neurological: Negative.   Hematological: Negative.   Psychiatric/Behavioral: Negative.       Today's Vitals   08/30/23 1029  BP: 120/80  Pulse: 77  Temp: 98.8 F (37.1 C)  TempSrc: Oral  Weight: 178 lb 9.6 oz (81 kg)  Height: 5' 5 (1.651 m)  PainSc: 0-No pain   Body mass index is 29.72 kg/m.  Wt Readings from Last 3 Encounters:  08/30/23 178 lb 9.6 oz (81 kg)  07/20/23 178 lb 12.8 oz (81.1 kg)  07/13/23 180 lb 5.4 oz (81.8 kg)     Objective:  Physical Exam Vitals and nursing note reviewed.         Assessment And Plan:     Encounter for annual health examination  Abnormal glucose Assessment & Plan: HgbA1c is stable. Continue focusing on healthy diet low in sugar and starches.   Orders: -     CMP14+EGFR -     Hemoglobin A1c -     Amb ref to Medical Nutrition Therapy-MNT  Abnormal thyroid  function test Assessment & Plan: She has not seen Endo since December, will recheck thyroid  levels  Orders: -     Lipid panel -     TSH + free T4 -     Amb ref to Medical Nutrition Therapy-MNT  COVID-19 vaccination declined Assessment & Plan: Declines covid 19 vaccine. Discussed risk of covid 70 and if she changes her mind about the vaccine to call the office. Education has been provided regarding the importance of this vaccine but patient still declined. Advised may receive this vaccine at local pharmacy or Health Dept.or vaccine clinic. Aware to provide a copy of the vaccination record if obtained from local pharmacy or Health Dept.  Encouraged to take multivitamin, vitamin d , vitamin c and zinc to increase immune system. Aware can call office if would like to have vaccine here at office. Verbalized acceptance and understanding.    Other fatigue Assessment & Plan: Metabolic labs have been normal. Encouraged to stay well hydrated and take a multivitamin  Orders: -     Amb ref to  Medical Nutrition Therapy-MNT  Non-traumatic rhabdomyolysis Assessment & Plan: Will recheck CK one more time to see if improving. Symptoms have improved significantly. If levels improve she can slowly return to exercising regularly   Orders: -     CK  Vitamin D  insufficiency Assessment & Plan: Will check vitamin D  level and supplement as needed.    Also encouraged to spend 15 minutes in the sun daily.    Orders: -     VITAMIN D  25 Hydroxy (Vit-D Deficiency, Fractures)    Return for 1 year physical, 20m  PRE DM.  Patient was given opportunity to ask questions. Patient verbalized understanding of the plan and was able to repeat key elements of the plan. All questions were answered to their satisfaction.   Susanna Epley, FNP  I, Susanna Epley, FNP, have reviewed all documentation for this visit. The documentation on 08/30/23 for the exam, diagnosis, procedures, and orders are all accurate and complete.

## 2023-08-30 NOTE — Assessment & Plan Note (Signed)
 Metabolic labs have been normal. Encouraged to stay well hydrated and take a multivitamin

## 2023-08-30 NOTE — Assessment & Plan Note (Signed)
 Will check vitamin D level and supplement as needed.    Also encouraged to spend 15 minutes in the sun daily.

## 2023-08-30 NOTE — Assessment & Plan Note (Signed)
 She has not seen Endo since December, will recheck thyroid  levels

## 2023-08-31 LAB — CMP14+EGFR
ALT: 12 IU/L (ref 0–32)
AST: 15 IU/L (ref 0–40)
Albumin: 4.3 g/dL (ref 3.8–4.9)
Alkaline Phosphatase: 71 IU/L (ref 44–121)
BUN/Creatinine Ratio: 14 (ref 9–23)
BUN: 10 mg/dL (ref 6–24)
Bilirubin Total: 0.3 mg/dL (ref 0.0–1.2)
CO2: 22 mmol/L (ref 20–29)
Calcium: 9.5 mg/dL (ref 8.7–10.2)
Chloride: 101 mmol/L (ref 96–106)
Creatinine, Ser: 0.69 mg/dL (ref 0.57–1.00)
Globulin, Total: 2.8 g/dL (ref 1.5–4.5)
Glucose: 93 mg/dL (ref 70–99)
Potassium: 4.9 mmol/L (ref 3.5–5.2)
Sodium: 140 mmol/L (ref 134–144)
Total Protein: 7.1 g/dL (ref 6.0–8.5)
eGFR: 104 mL/min/{1.73_m2} (ref >59)

## 2023-08-31 LAB — VITAMIN D 25 HYDROXY (VIT D DEFICIENCY, FRACTURES): Vit D, 25-Hydroxy: 22.1 ng/mL — ABNORMAL LOW (ref 30.0–100.0)

## 2023-08-31 LAB — LIPID PANEL
Chol/HDL Ratio: 2.8 ratio (ref 0.0–4.4)
Cholesterol, Total: 162 mg/dL (ref 100–199)
HDL: 57 mg/dL (ref >39)
LDL Chol Calc (NIH): 94 mg/dL (ref 0–99)
Triglycerides: 54 mg/dL (ref 0–149)
VLDL Cholesterol Cal: 11 mg/dL (ref 5–40)

## 2023-08-31 LAB — TSH+FREE T4
Free T4: 1.04 ng/dL (ref 0.82–1.77)
TSH: 0.413 u[IU]/mL — ABNORMAL LOW (ref 0.450–4.500)

## 2023-08-31 LAB — HEMOGLOBIN A1C
Est. average glucose Bld gHb Est-mCnc: 126 mg/dL
Hgb A1c MFr Bld: 6 % — ABNORMAL HIGH (ref 4.8–5.6)

## 2023-08-31 LAB — CK: Total CK: 168 U/L (ref 32–182)

## 2023-09-02 ENCOUNTER — Encounter (HOSPITAL_BASED_OUTPATIENT_CLINIC_OR_DEPARTMENT_OTHER): Payer: Self-pay | Admitting: Physical Therapy

## 2023-09-05 ENCOUNTER — Encounter: Payer: Self-pay | Admitting: Dietician

## 2023-09-05 ENCOUNTER — Encounter: Attending: Nurse Practitioner | Admitting: Dietician

## 2023-09-05 VITALS — Wt 177.0 lb

## 2023-09-05 DIAGNOSIS — R7309 Other abnormal glucose: Secondary | ICD-10-CM | POA: Insufficient documentation

## 2023-09-05 NOTE — Progress Notes (Signed)
 Medical Nutrition Therapy  Appointment Start time:  [phone]  Appointment End time:  0910  Primary concerns today: A1c, menopause, weight   Referral diagnosis: abnormal glucose; Employee Visit 1 Preferred learning style: no preference indicated Learning readiness: ready   NUTRITION ASSESSMENT   Anthropometrics   Wt: 177 lb  Clinical Medical Hx: allergies, anemia, prediabetes Medications: reviewed Labs: 08/30/23: vitamin d  22, A1c 6.0% Notable Signs/Symptoms: none reported Food Allergies: none reported  Lifestyle & Dietary Hx  Pt reports she is in menopause and her body is changing, and she wants to start working on her health and better understanding how to eat.   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 Pt reports reps bring lunch most days of the week, but if not she goes home to make lunch.   Pt reports she would like to lose some weight.   Pt reports she has gone through cycles of being active and more inactive. Pt states in April she started working out with a Psychologist, educational and got Rhabdomyolysis. Pt reports she was recently cleared to start exercise again. Pt states she was doing some walking inconsistently during the healing process, but wants to be able to jog the route near her home so she wants to start walk/jogging consistently.   Pt states sometimes she fasts for spiritual reasons.   Pt reports being highly active in her church.    Estimated daily fluid intake: 64 oz Supplements: omega 3, vitamin c, vitamin d , magnesium, MVI, berberine Sleep: varies, 6-8 hours, pt reports sometimes not feeling rested.  Stress / self-care: moderate stress Current average weekly physical activity: some walking/jogging (inconsistent)  24-Hr Dietary Recall First Meal: skips OR omelet with cheese and tortilla OR oatmeal with prunes, flax, pecans, molasses, peanut butter Snack: fruit (melon) OR oatmeal with vanilla  greek yogurt Second Meal: 1pm: reps bring lunch: outback grilled chicken/salmon, potato and veggies OR salsaritas OR pizza/sandwiches Snack: none Third Meal: 5:30pm: popcorn OR oatmeal with pecans, prunes, peanut butter, yogurt Snack: none Beverages: 64 oz water, very occasional coffee or juice   NUTRITION DIAGNOSIS  NB-1.1 Food and nutrition-related knowledge deficit As related to lack of prior education by a registered dietitian.  As evidenced by pt report.   NUTRITION INTERVENTION  Nutrition education (E-1) on the following topics:   Plate Method Fruits & Vegetables: Aim to fill half your plate with a variety of fruits and vegetables. They are rich in vitamins, minerals, and fiber, and can help reduce the risk of chronic diseases. Choose a colorful assortment of fruits and vegetables to ensure you get a wide range of nutrients. Grains and Starches: Make at least half of your grain choices whole grains, such as brown rice, whole wheat bread, and oats. Whole grains provide fiber, which aids in digestion and healthy cholesterol levels. Aim for whole forms of starchy vegetables such as potatoes, sweet potatoes, beans, peas, and corn, which are fiber rich and provide many vitamins and minerals.  Protein: Incorporate lean sources of protein, such as poultry, fish, beans, nuts, and seeds, into your meals. Protein is essential for building and repairing tissues, staying full, balancing blood sugar, as well as supporting immune function. Dairy: Include low-fat or fat-free dairy products like milk, yogurt, and cheese in your diet. Dairy foods are excellent sources of calcium and vitamin D , which are crucial for bone health.   Fiber Dietary fiber is essential for health and comes in  two types: soluble and insoluble fiber. Soluble Fiber: Characteristics: Dissolves in water, forming a gel-like substance. Sources: Oats, nuts, seeds, beans, lentils, fruits (apples, citrus), and vegetables (carrots).  Benefits: Regulates blood sugar, lowers LDL cholesterol, supports heart health, and aids in digestion by forming a gel that prevents diarrhea. Insoluble Fiber: Characteristics: Does not dissolve in water and adds bulk to stool. Sources: Whole grains, bran, nuts, seeds, vegetables (cauliflower, green beans), and fruits (apples with skin, berries). Benefits: Promotes regular bowel movements, aids in weight management, and prevents diverticular disease.  Hydration Adequate hydration is crucial for optimal nutrition and overall health. It aids in the digestion and absorption of nutrients, ensuring the body can effectively process and transport them to cells. Water also supports detoxification by helping the kidneys flush out waste, while maintaining hydration promotes energy levels and metabolism. Proper hydration helps control appetite and ensures a healthy balance of electrolytes, which are vital for muscle function and cellular processes. Overall, staying well-hydrated is essential for the body to utilize nutrients efficiently and maintain balance.  Handouts Provided Include  Plate Method  Learning Style & Readiness for Change Teaching method utilized: Visual & Auditory  Demonstrated degree of understanding via: Teach Back  Barriers to learning/adherence to lifestyle change: none  Goals Established by Pt  Goal 1: go walk/jog 3 days per week for at least 30 minutes.   Goal 2: drink 2.5 of your water bottles daily.   Goal 3: do some cooking/ingredient prep at least once a week.    MONITORING & EVALUATION Dietary intake, weekly physical activity, and follow up in 1.5 weeks.  Next Steps  Patient is to call for questions.

## 2023-09-05 NOTE — Patient Instructions (Signed)
 Goals Established by Pt  Goal 1: go walk/jog 3 days per week for at least 30 minutes.   Goal 2: drink 2.5 of your water bottles daily.   Goal 3: do some cooking/ingredient prep at least once a week.

## 2023-09-06 ENCOUNTER — Ambulatory Visit: Payer: Self-pay | Admitting: Nurse Practitioner

## 2023-09-06 ENCOUNTER — Encounter: Payer: Self-pay | Admitting: Nurse Practitioner

## 2023-09-09 ENCOUNTER — Encounter (HOSPITAL_BASED_OUTPATIENT_CLINIC_OR_DEPARTMENT_OTHER): Admitting: Physical Therapy

## 2023-09-13 ENCOUNTER — Encounter (HOSPITAL_BASED_OUTPATIENT_CLINIC_OR_DEPARTMENT_OTHER): Payer: Self-pay | Admitting: Physical Therapy

## 2023-09-14 ENCOUNTER — Encounter: Payer: Self-pay | Admitting: Dietician

## 2023-09-14 ENCOUNTER — Encounter: Attending: Nurse Practitioner | Admitting: Dietician

## 2023-09-14 DIAGNOSIS — E559 Vitamin D deficiency, unspecified: Secondary | ICD-10-CM | POA: Insufficient documentation

## 2023-09-14 NOTE — Progress Notes (Signed)
 Medical Nutrition Therapy  Appointment Start time:  1710  Appointment End time:  1735  This is a virtual visit. Patient Location: home Provider Location: office  Primary concerns today: A1c, menopause, weight   Referral diagnosis: abnormal glucose; Employee Visit 1 Preferred learning style: no preference indicated Learning readiness: ready  NUTRITION ASSESSMENT   Anthropometrics   Wt 09/05/23: 177 lb  Clinical Medical Hx: allergies, anemia, prediabetes Medications: reviewed Labs: 08/30/23: vitamin d  22, A1c 6.0% Notable Signs/Symptoms: none reported Food Allergies: none reported  Lifestyle & Dietary Hx  Pt states she has been doing her morning walk/jog consistently. Pt states she did it 3 days last week and has done it 2 this week but is feeling more tired this week. Pt reports she has been trying to get out every morning before work.   Pt reports she has noticed increased energy levels. Pt states she has also noticed recently that her sleep is not where she wants it to be.   Pt states she has started to re-incorporate some resistance training by doing 1 set of 10 reps of arm movements.   Pt states she has been doing well with water intake, getting around 64-72 oz daily. Pt states she also prepped a big pot of beans to eat on.   Estimated daily fluid intake: 64-72 oz Supplements: omega 3, vitamin c, vitamin d , magnesium, MVI, berberine Sleep: varies, 6-8 hours, pt reports sometimes not feeling rested.  Stress / self-care: moderate stress Current average weekly physical activity: some walking/jogging (inconsistent)  24-Hr Dietary Recall First Meal: boiled eggs OR oatmeal with flax, pecans, yogurt, and fruit, peanut butter Snack: none OR boiled eggs Second Meal: 1pm: reps bring lunch: outback grilled chicken/salmon, potato and veggies OR salsaritas OR pizza/sandwiches Snack: none Third Meal: mashed beans on wrap with lettuce, tomato, cheese Snack: none Beverages: 64-72  oz water, very occasional coffee or juice   NUTRITION DIAGNOSIS  NB-1.1 Food and nutrition-related knowledge deficit As related to lack of prior education by a registered dietitian.  As evidenced by pt report.   NUTRITION INTERVENTION  Nutrition education (E-1) on the following topics:   Body Acceptance Accepting your body as it is today is a crucial first step before starting any health journey. Embracing body acceptance fosters kindness and patience, helping you build a healthier, more sustainable relationship with food and yourself. Instead of focusing solely on the scale, tune into how your body feels and prioritize nourishing it with balanced meals that support your energy and wellbeing. Set goals that celebrate strength, stamina, and overall health rather than just weight. Remember, health is more than a number--it includes mental, emotional, and physical wellness. Treat yourself with compassion and celebrate every step of progress along the way.  Exercise Finding an exercise you enjoy is crucial for maintaining long-term fitness and overall health. Enjoyable activities are more likely to become regular habits, making it easier to stay consistent with physical activity. When you look forward to your workouts, exercise becomes a positive experience rather than a chore, reducing the likelihood of burnout or quitting. Enjoyable exercise also enhances mental well-being, as engaging in activities you love can boost mood, reduce stress, and provide a sense of accomplishment. Aim for 150 minutes of physical activity weekly. Make physical activity a part of your week. Try to include at least 30 minutes of physical activity 5 days each week or at least 150 minutes per week.   Plate Method Fruits & Vegetables: Aim to fill half your plate  with a variety of fruits and vegetables. They are rich in vitamins, minerals, and fiber, and can help reduce the risk of chronic diseases. Choose a colorful assortment  of fruits and vegetables to ensure you get a wide range of nutrients. Grains and Starches: Make at least half of your grain choices whole grains, such as brown rice, whole wheat bread, and oats. Whole grains provide fiber, which aids in digestion and healthy cholesterol levels. Aim for whole forms of starchy vegetables such as potatoes, sweet potatoes, beans, peas, and corn, which are fiber rich and provide many vitamins and minerals.  Protein: Incorporate lean sources of protein, such as poultry, fish, beans, nuts, and seeds, into your meals. Protein is essential for building and repairing tissues, staying full, balancing blood sugar, as well as supporting immune function. Dairy: Include low-fat or fat-free dairy products like milk, yogurt, and cheese in your diet. Dairy foods are excellent sources of calcium and vitamin D , which are crucial for bone health.   Fiber Dietary fiber is essential for health and comes in two types: soluble and insoluble fiber. Soluble Fiber: Characteristics: Dissolves in water, forming a gel-like substance. Sources: Oats, nuts, seeds, beans, lentils, fruits (apples, citrus), and vegetables (carrots). Benefits: Regulates blood sugar, lowers LDL cholesterol, supports heart health, and aids in digestion by forming a gel that prevents diarrhea. Insoluble Fiber: Characteristics: Does not dissolve in water and adds bulk to stool. Sources: Whole grains, bran, nuts, seeds, vegetables (cauliflower, green beans), and fruits (apples with skin, berries). Benefits: Promotes regular bowel movements, aids in weight management, and prevents diverticular disease.  Hydration Adequate hydration is crucial for optimal nutrition and overall health. It aids in the digestion and absorption of nutrients, ensuring the body can effectively process and transport them to cells. Water also supports detoxification by helping the kidneys flush out waste, while maintaining hydration promotes energy levels  and metabolism. Proper hydration helps control appetite and ensures a healthy balance of electrolytes, which are vital for muscle function and cellular processes. Overall, staying well-hydrated is essential for the body to utilize nutrients efficiently and maintain balance.  Handouts Provided Include  Plate Method (initial assessment) Healthy Sleep (sent on email)  Learning Style & Readiness for Change Teaching method utilized: Visual & Auditory  Demonstrated degree of understanding via: Teach Back  Barriers to learning/adherence to lifestyle change: none  New Goal  Goal 1: increase walking/jogging to 45 minutes at least 3 days a week.  Assessment of Previous Goals Established by Pt  Goal 1: go walk/jog 3 days per week for at least 30 minutes.   Goal 2: drink 2.5 of your water bottles daily.   Goal 3: do some cooking/ingredient prep at least once a week.    MONITORING & EVALUATION Dietary intake, weekly physical activity, and follow up PRN.  Next Steps  Patient is to call for questions.

## 2023-09-19 ENCOUNTER — Encounter (HOSPITAL_BASED_OUTPATIENT_CLINIC_OR_DEPARTMENT_OTHER): Payer: Self-pay | Admitting: Physical Therapy

## 2023-09-23 ENCOUNTER — Encounter (HOSPITAL_BASED_OUTPATIENT_CLINIC_OR_DEPARTMENT_OTHER): Admitting: Physical Therapy

## 2024-02-21 ENCOUNTER — Ambulatory Visit: Payer: Commercial Managed Care - PPO | Admitting: Internal Medicine

## 2024-02-21 ENCOUNTER — Ambulatory Visit: Payer: Self-pay | Admitting: Nurse Practitioner

## 2024-09-02 ENCOUNTER — Encounter: Admitting: Nurse Practitioner

## 2024-09-04 ENCOUNTER — Encounter: Payer: Self-pay | Admitting: Nurse Practitioner
